# Patient Record
Sex: Female | Born: 1965 | Race: White | Hispanic: No | Marital: Married | Smoking: Never smoker
Health system: Southern US, Community
[De-identification: ages and names within clinical notes are randomized; demographics above are authoritative.]

## PROBLEM LIST (undated history)

## (undated) DIAGNOSIS — N920 Excessive and frequent menstruation with regular cycle: Secondary | ICD-10-CM

## (undated) DIAGNOSIS — R3129 Other microscopic hematuria: Secondary | ICD-10-CM

## (undated) DIAGNOSIS — F988 Other specified behavioral and emotional disorders with onset usually occurring in childhood and adolescence: Secondary | ICD-10-CM

## (undated) DIAGNOSIS — N943 Premenstrual tension syndrome: Secondary | ICD-10-CM

## (undated) HISTORY — DX: Other microscopic hematuria: R31.29

## (undated) HISTORY — DX: Other specified behavioral and emotional disorders with onset usually occurring in childhood and adolescence: F98.8

## (undated) HISTORY — DX: Excessive and frequent menstruation with regular cycle: N92.0

## (undated) HISTORY — DX: Premenstrual tension syndrome: N94.3

## (undated) HISTORY — PX: OVARIAN CYST REMOVAL: SHX89

---

## 1998-05-08 ENCOUNTER — Inpatient Hospital Stay (HOSPITAL_COMMUNITY): Admission: AD | Admit: 1998-05-08 | Discharge: 1998-05-10 | Payer: Self-pay | Admitting: Obstetrics and Gynecology

## 1998-06-18 ENCOUNTER — Other Ambulatory Visit: Admission: RE | Admit: 1998-06-18 | Discharge: 1998-06-18 | Payer: Self-pay | Admitting: Obstetrics and Gynecology

## 1999-07-01 ENCOUNTER — Other Ambulatory Visit: Admission: RE | Admit: 1999-07-01 | Discharge: 1999-07-01 | Payer: Self-pay | Admitting: Gynecology

## 1999-11-11 ENCOUNTER — Other Ambulatory Visit: Admission: RE | Admit: 1999-11-11 | Discharge: 1999-11-11 | Payer: Self-pay | Admitting: Gynecology

## 2000-09-07 ENCOUNTER — Other Ambulatory Visit: Admission: RE | Admit: 2000-09-07 | Discharge: 2000-09-07 | Payer: Self-pay | Admitting: Obstetrics and Gynecology

## 2001-07-28 ENCOUNTER — Other Ambulatory Visit: Admission: RE | Admit: 2001-07-28 | Discharge: 2001-07-28 | Payer: Self-pay | Admitting: Urology

## 2001-11-13 ENCOUNTER — Other Ambulatory Visit: Admission: RE | Admit: 2001-11-13 | Discharge: 2001-11-13 | Payer: Self-pay | Admitting: *Deleted

## 2003-02-11 ENCOUNTER — Other Ambulatory Visit: Admission: RE | Admit: 2003-02-11 | Discharge: 2003-02-11 | Payer: Self-pay | Admitting: Obstetrics and Gynecology

## 2004-02-21 ENCOUNTER — Other Ambulatory Visit: Admission: RE | Admit: 2004-02-21 | Discharge: 2004-02-21 | Payer: Self-pay | Admitting: Obstetrics and Gynecology

## 2005-06-24 ENCOUNTER — Other Ambulatory Visit: Admission: RE | Admit: 2005-06-24 | Discharge: 2005-06-24 | Payer: Self-pay | Admitting: Obstetrics and Gynecology

## 2005-09-20 ENCOUNTER — Ambulatory Visit (HOSPITAL_BASED_OUTPATIENT_CLINIC_OR_DEPARTMENT_OTHER): Admission: RE | Admit: 2005-09-20 | Discharge: 2005-09-20 | Payer: Self-pay | Admitting: Obstetrics and Gynecology

## 2005-09-20 HISTORY — PX: ENDOMETRIAL ABLATION: SHX621

## 2007-02-13 ENCOUNTER — Emergency Department (HOSPITAL_COMMUNITY): Admission: EM | Admit: 2007-02-13 | Discharge: 2007-02-13 | Payer: Self-pay | Admitting: Family Medicine

## 2008-09-06 ENCOUNTER — Other Ambulatory Visit: Admission: RE | Admit: 2008-09-06 | Discharge: 2008-09-06 | Payer: Self-pay | Admitting: Obstetrics & Gynecology

## 2008-09-11 ENCOUNTER — Encounter: Admission: RE | Admit: 2008-09-11 | Discharge: 2008-09-11 | Payer: Self-pay | Admitting: Obstetrics and Gynecology

## 2010-02-03 ENCOUNTER — Encounter: Admission: RE | Admit: 2010-02-03 | Discharge: 2010-02-03 | Payer: Self-pay | Admitting: Obstetrics and Gynecology

## 2010-09-20 ENCOUNTER — Encounter: Payer: Self-pay | Admitting: Obstetrics and Gynecology

## 2011-01-15 NOTE — Op Note (Signed)
NAMEMEMORI, SAMMON                  ACCOUNT NO.:  [id number]   MEDICAL RECORD NO.:  [id number]          PATIENT TYPE:  AMB   LOCATION:  [facility]                         FACILITY:  [facility]   PHYSICIAN:  Cynthia P. Romine, M.D.DATE OF BIRTH:  [birth year]   DATE OF PROCEDURE:  09/20/2005  DATE OF DISCHARGE:                                 OPERATIVE REPORT   PREOPERATIVE DIAGNOSIS:  Menorrhagia.   POSTOPERATIVE DIAGNOSIS:  Menorrhagia.   PROCEDURE:  Endometrial ablation using the Hydrotherm ablation technique.   SURGEON:  Cynthia P. Romine, M.D.   ANESTHESIA:  General by LMA.   ESTIMATED BLOOD LOSS:  50 mL.   COMPLICATIONS:  None. There was a small posterior cervical tear from the  tenaculum that required suturing for hemostasis, that was the genesis of the  50 mL blood loss.   DESCRIPTION OF PROCEDURE:  The patient was taken to the operating room and  after induction of adequate general anesthesia was placed in the dorsal  lithotomy position and prepped and draped in the usual fashion. She had  emptied her bladder immediately before the procedure so a catheter was not  inserted. A posterior weighted and anterior Sims retractor were placed and  the cervix was grasped off the anterior lip with a single-tooth tenaculum.  The cervix was dilated to #23 Shawnie Pons. The ablation hysteroscope was  introduced and would not pass through the canal because of anteflexion.  Therefore a second tenaculum was placed on the posterior lip of the cervix  and when pressure was applied to that it tore through. It was reapplied, the  cervix was dilated to a #25 Shawnie Pons. The scope was introduced easily.  Documentation of proper placement was taken by noting by taking a photograph  of the cavity showing the tubal ostia. The scope was withdrawn to just  inside the internal os, endometrial ablation was carried out in the usual  fashion. After the 10 minute ablation period, photographic documentation was  again taken of  the cavity. The scope was withdrawn, the instruments removed  from the cervix. Two sutures of #0 chromic were placed on the posterior lip  of the cervix for hemostasis which was achieved easily. The instruments  removed from the vagina and the procedure was terminated. The patient  tolerated it well and went to post anesthesia recovery in satisfactory  condition.           ______________________________  Edwena Felty. Romine, M.D.     CPR/MEDQ  D:  09/20/2005  T:  09/21/2005  Job:  [id number]

## 2011-09-20 ENCOUNTER — Other Ambulatory Visit: Payer: Self-pay | Admitting: Obstetrics and Gynecology

## 2011-09-20 DIAGNOSIS — Z1231 Encounter for screening mammogram for malignant neoplasm of breast: Secondary | ICD-10-CM

## 2011-10-07 ENCOUNTER — Ambulatory Visit: Payer: Self-pay

## 2011-10-07 ENCOUNTER — Ambulatory Visit
Admission: RE | Admit: 2011-10-07 | Discharge: 2011-10-07 | Disposition: A | Discharge status: Home / Self Care | Payer: BC Managed Care – PPO | Source: Ambulatory Visit | Attending: Obstetrics and Gynecology | Admitting: Obstetrics and Gynecology

## 2011-10-07 DIAGNOSIS — Z1231 Encounter for screening mammogram for malignant neoplasm of breast: Secondary | ICD-10-CM

## 2012-05-22 LAB — HM PAP SMEAR: HM Pap smear: NEGATIVE

## 2012-10-05 ENCOUNTER — Other Ambulatory Visit: Payer: Self-pay | Admitting: Obstetrics and Gynecology

## 2012-10-05 DIAGNOSIS — Z1231 Encounter for screening mammogram for malignant neoplasm of breast: Secondary | ICD-10-CM

## 2012-11-09 ENCOUNTER — Ambulatory Visit: Payer: BC Managed Care – PPO

## 2012-12-04 ENCOUNTER — Ambulatory Visit
Admission: RE | Admit: 2012-12-04 | Discharge: 2012-12-04 | Disposition: A | Discharge status: Home / Self Care | Payer: 59 | Source: Ambulatory Visit | Attending: Obstetrics and Gynecology | Admitting: Obstetrics and Gynecology

## 2012-12-04 DIAGNOSIS — Z1231 Encounter for screening mammogram for malignant neoplasm of breast: Secondary | ICD-10-CM

## 2013-05-24 ENCOUNTER — Ambulatory Visit: Payer: Self-pay | Admitting: Nurse Practitioner

## 2013-06-04 ENCOUNTER — Encounter: Payer: Self-pay | Admitting: Nurse Practitioner

## 2013-06-05 ENCOUNTER — Ambulatory Visit (INDEPENDENT_AMBULATORY_CARE_PROVIDER_SITE_OTHER): Payer: 59 | Admitting: Nurse Practitioner

## 2013-06-05 ENCOUNTER — Encounter: Payer: Self-pay | Admitting: Nurse Practitioner

## 2013-06-05 VITALS — BP 130/76 | HR 76 | Resp 16 | Ht 64.5 in | Wt 132.0 lb

## 2013-06-05 DIAGNOSIS — Z23 Encounter for immunization: Secondary | ICD-10-CM

## 2013-06-05 DIAGNOSIS — Z0000 Encounter for general adult medical examination without abnormal findings: Secondary | ICD-10-CM

## 2013-06-05 DIAGNOSIS — Z01419 Encounter for gynecological examination (general) (routine) without abnormal findings: Secondary | ICD-10-CM

## 2013-06-05 MED ORDER — FLUOXETINE HCL 20 MG PO CAPS: 20.0000 mg | ORAL_CAPSULE | Freq: Every day | ORAL | Status: DC

## 2013-06-05 NOTE — Patient Instructions (Signed)

## 2013-06-05 NOTE — Progress Notes (Signed)
Patient ID: Sabrina Blair, female   DOB: 1965/10/18, 47 y.o.   MRN: [id number] 47 y.o. [id number] Married Caucasian Fe here for annual exam. Doing well.  Menses in regular at 26 - 28 days, lasting 2-3 days. Some headaches, oc migraine.  PMS is better with Prozac med's.   Patient's last menstrual period was 05/30/2013.          Sexually active: yes  The current method of family planning is vasectomy.    Exercising: yes  tennis 3-4 times per week Smoker:  no  Health Maintenance: Pap: 923/13, WNL, neg HR HPV MMG: 12/04/12, BI-RADS 1: negative TDaP: 09/2002 Labs: HB: 12.0 Urine:  Trace blood, pH 5.0   reports that she has never smoked. She has never used smokeless tobacco. She reports that she does not drink alcohol or use illicit drugs.  Past Medical History  Diagnosis Date  . Family history of PMS (premenstrual syndrome)     better on prozac  . Microscopic hematuria     neg uro workup, Dr. Aldean Ast  . Attention deficit disorder of adult     Past Surgical History  Procedure Laterality Date  . Endometrial ablation  09/20/05    Current Outpatient Prescriptions  Medication Sig Dispense Refill  . FLUoxetine (PROZAC) 20 MG capsule Take 20 mg by mouth daily.      . Multiple Vitamin (MULTIVITAMIN) tablet Take 1 tablet by mouth daily.      . Naproxen Sodium (ALEVE) 220 MG CAPS Take by mouth as needed.       No current facility-administered medications for this visit.    Family History  Problem Relation Age of Onset  . Breast cancer Mother 17  . Breast cancer Maternal Aunt   . Breast cancer Maternal Aunt     ROS:  Pertinent items are noted in HPI.  Otherwise, a comprehensive ROS was negative.  Exam:   BP 130/76  Pulse 76  Resp 16  Ht 5' 4.5" (1.638 m)  Wt 132 lb (59.875 kg)  BMI 22.32 kg/m2  LMP 05/30/2013 Height: 5' 4.5" (163.8 cm)  Ht Readings from Last 3 Encounters:  06/05/13 5' 4.5" (1.638 m)    General appearance: alert, cooperative and appears stated age Head:  Normocephalic, without obvious abnormality, atraumatic Neck: no adenopathy, supple, symmetrical, trachea midline and thyroid normal to inspection and palpation Lungs: clear to auscultation bilaterally Breasts: normal appearance, no masses or tenderness Heart: regular rate and rhythm Abdomen: soft, non-tender; no masses,  no organomegaly Extremities: extremities normal, atraumatic, no cyanosis or edema Skin: Skin color, texture, turgor normal. No rashes or lesions Lymph nodes: Cervical, supraclavicular, and axillary nodes normal. No abnormal inguinal nodes palpated Neurologic: Grossly normal   Pelvic: External genitalia:  no lesions              Urethra:  normal appearing urethra with no masses, tenderness or lesions              Bartholin's and Skene's: normal                 Vagina: normal appearing vagina with normal color and discharge, no lesions              Cervix: anteverted              Pap taken: no Bimanual Exam:  Uterus:  normal size, contour, position, consistency, mobility, non-tender              Adnexa: no mass, fullness, tenderness  Rectovaginal: Confirms               Anus:  normal sphincter tone, no lesions  A:  Well Woman with normal exam  Normal menses  History of PMS  Update immunization  P:   Pap smear as per guidelines   Mammogram is due now and is scheduled  Return for fasting labs  Colonoscopy to be scheduled  TDaP given today  Counseled on breast self exam, adequate intake of calcium and vitamin D, diet and exercise return annually or prn  An After Visit Summary was printed and given to the patient.

## 2013-06-08 ENCOUNTER — Other Ambulatory Visit (INDEPENDENT_AMBULATORY_CARE_PROVIDER_SITE_OTHER): Payer: 59

## 2013-06-08 DIAGNOSIS — Z0000 Encounter for general adult medical examination without abnormal findings: Secondary | ICD-10-CM

## 2013-06-08 LAB — LIPID PANEL
Cholesterol: 159 mg/dL (ref 0–200)
HDL: 74 mg/dL (ref >39)
LDL Cholesterol: 75 mg/dL (ref 0–99)
Triglycerides: 48 mg/dL (ref <150)
VLDL: 10 mg/dL (ref 0–40)

## 2013-06-08 LAB — COMPREHENSIVE METABOLIC PANEL
Albumin: 4 g/dL (ref 3.5–5.2)
Alkaline Phosphatase: 43 U/L (ref 39–117)
BUN: 10 mg/dL (ref 6–23)
CO2: 26 mEq/L (ref 19–32)
Calcium: 9.3 mg/dL (ref 8.4–10.5)
Chloride: 104 mEq/L (ref 96–112)
Creat: 0.67 mg/dL (ref 0.50–1.10)
Glucose, Bld: 81 mg/dL (ref 70–99)
Potassium: 5.1 mEq/L (ref 3.5–5.3)

## 2013-06-10 NOTE — Progress Notes (Signed)
Encounter reviewed by Dr. Conley Simmonds.  Mammogram 12/04/12 - normal.

## 2013-07-05 ENCOUNTER — Other Ambulatory Visit: Payer: Self-pay

## 2014-05-07 ENCOUNTER — Other Ambulatory Visit: Payer: Self-pay

## 2014-05-07 DIAGNOSIS — Z1231 Encounter for screening mammogram for malignant neoplasm of breast: Secondary | ICD-10-CM

## 2014-05-16 ENCOUNTER — Ambulatory Visit: Payer: 59

## 2014-05-23 ENCOUNTER — Other Ambulatory Visit: Payer: Self-pay

## 2014-05-23 ENCOUNTER — Ambulatory Visit
Admission: RE | Admit: 2014-05-23 | Discharge: 2014-05-23 | Disposition: A | Discharge status: Home / Self Care | Payer: 59 | Source: Ambulatory Visit

## 2014-05-23 DIAGNOSIS — Z1231 Encounter for screening mammogram for malignant neoplasm of breast: Secondary | ICD-10-CM

## 2014-05-27 ENCOUNTER — Other Ambulatory Visit: Payer: Self-pay | Admitting: Nurse Practitioner

## 2014-05-27 DIAGNOSIS — R928 Other abnormal and inconclusive findings on diagnostic imaging of breast: Secondary | ICD-10-CM

## 2014-06-03 ENCOUNTER — Ambulatory Visit
Admission: RE | Admit: 2014-06-03 | Discharge: 2014-06-03 | Disposition: A | Discharge status: Home / Self Care | Payer: 59 | Source: Ambulatory Visit | Attending: Nurse Practitioner | Admitting: Nurse Practitioner

## 2014-06-03 DIAGNOSIS — R928 Other abnormal and inconclusive findings on diagnostic imaging of breast: Secondary | ICD-10-CM

## 2014-06-24 ENCOUNTER — Encounter: Payer: Self-pay | Admitting: Nurse Practitioner

## 2014-06-24 ENCOUNTER — Ambulatory Visit (INDEPENDENT_AMBULATORY_CARE_PROVIDER_SITE_OTHER): Payer: 59 | Admitting: Nurse Practitioner

## 2014-06-24 VITALS — BP 120/76 | HR 72 | Ht 65.0 in | Wt 136.0 lb

## 2014-06-24 DIAGNOSIS — Z1211 Encounter for screening for malignant neoplasm of colon: Secondary | ICD-10-CM

## 2014-06-24 DIAGNOSIS — Z0000 Encounter for general adult medical examination without abnormal findings: Secondary | ICD-10-CM

## 2014-06-24 DIAGNOSIS — Z01419 Encounter for gynecological examination (general) (routine) without abnormal findings: Secondary | ICD-10-CM

## 2014-06-24 DIAGNOSIS — R319 Hematuria, unspecified: Secondary | ICD-10-CM

## 2014-06-24 LAB — POCT URINALYSIS DIPSTICK
Bilirubin, UA: NEGATIVE
GLUCOSE UA: NEGATIVE
Ketones, UA: NEGATIVE
Leukocytes, UA: NEGATIVE
Nitrite, UA: NEGATIVE
Protein, UA: NEGATIVE
UROBILINOGEN UA: NEGATIVE
pH, UA: 7

## 2014-06-24 MED ORDER — FLUOXETINE HCL 20 MG PO CAPS: 20.0000 mg | ORAL_CAPSULE | Freq: Every day | ORAL | Status: DC

## 2014-06-24 NOTE — Patient Instructions (Signed)

## 2014-06-24 NOTE — Progress Notes (Signed)
Patient ID: Sabrina LouisLisa D Golightly, female   DOB: 1965-10-04, 48 y.o.   MRN: [id number] 48 y.o. [id number] Married Caucasian Fe here for annual exam.  Menses is 26- 28 lasting 3 days.  Usually light since the HTA ablation 08/2005.   Patient's last menstrual period was 06/13/2014.          Sexually active: yes   The current method of family planning is vasectomy.     Exercising: yes  tennis 3-4 times per week Smoker:  no  Health Maintenance: Pap: 923/13, WNL, neg HR HPV MMG: 05/24/14, diagnostic and ultrasound 06/03/14, BI-RADS 2:  Benign findings, repeat in 1 year Colonoscopy:  never TDaP: 06/05/13 Labs:  HB:  12.1     Urine:  Trace RBC   reports that she has never smoked. She has never used smokeless tobacco. She reports that she does not drink alcohol or use illicit drugs.  Past Medical History  Diagnosis Date  . Microscopic hematuria     neg uro workup, Dr. Aldean AstKimbrough  . Attention deficit disorder of adult   . PMS (premenstrual syndrome)     symptoms better on Prozac  . Menorrhagia     Past Surgical History  Procedure Laterality Date  . Ovarian cyst removal  age 48  . Endometrial ablation  09/20/05    HTA    Current Outpatient Prescriptions  Medication Sig Dispense Refill  . FLUoxetine (PROZAC) 20 MG capsule Take 1 capsule (20 mg total) by mouth daily.  90 capsule  3  . methylphenidate (CONCERTA) 36 MG CR tablet Take 36 mg by mouth every morning.      . Multiple Vitamin (MULTIVITAMIN) tablet Take 1 tablet by mouth daily.      . Naproxen Sodium (ALEVE) 220 MG CAPS Take by mouth as needed.       No current facility-administered medications for this visit.    Family History  Problem Relation Age of Onset  . Breast cancer Mother 958  . Hypertension Mother   . Hyperlipidemia Mother   . Osteoarthritis Mother   . Breast cancer Maternal Aunt     postmeno  . Breast cancer Maternal Aunt     postmeno  . Diabetes Maternal Aunt   . Diabetes Maternal Grandfather     ROS:  Pertinent items  are noted in HPI.  Otherwise, a comprehensive ROS was negative.  Exam:   BP 120/76  Pulse 72  Ht 5\' 5"  (1.651 m)  Wt 136 lb (61.689 kg)  BMI 22.63 kg/m2  LMP 06/13/2014 Height: 5\' 5"  (165.1 cm)  Ht Readings from Last 3 Encounters:  06/24/14 5\' 5"  (1.651 m)  06/05/13 5' 4.5" (1.638 m)    General appearance: alert, cooperative and appears stated age Head: Normocephalic, without obvious abnormality, atraumatic Neck: no adenopathy, supple, symmetrical, trachea midline and thyroid normal to inspection and palpation Lungs: clear to auscultation bilaterally Breasts: normal appearance, no masses or tenderness Heart: regular rate and rhythm Abdomen: soft, non-tender; no masses,  no organomegaly Extremities: extremities normal, atraumatic, no cyanosis or edema Skin: Skin color, texture, turgor normal. No rashes or lesions Lymph nodes: Cervical, supraclavicular, and axillary nodes normal. No abnormal inguinal nodes palpated Neurologic: Grossly normal   Pelvic: External genitalia:  no lesions              Urethra:  normal appearing urethra with no masses, tenderness or lesions              Bartholin's and Skene's: normal  Vagina: normal appearing vagina with normal color and discharge, no lesions              Cervix: anteverted              Pap taken: No. Bimanual Exam:  Uterus:  normal size, contour, position, consistency, mobility, non-tender              Adnexa: no mass, fullness, tenderness               Rectovaginal: Confirms               Anus:  normal sphincter tone, no lesions  A:  Well Woman with normal exam  Normal menses   S/P HTA ablation 08/2005, husband vasectomy  History of PMS does well on SSRI  History of microalbinuria  History of ADD  FMH: breast cancer - mother and 2 MA    P:   Reviewed health and wellness pertinent to exam  Pap smear not taken today  Mammogram is due 9/16  Will follow with urine C&S and micro  Refill Prozac 20 mg daily for a  year  Will get GI referral for screening colonoscopy  Counseled on breast self exam, mammography screening, adequate intake of calcium and vitamin D, diet and exercise, Kegel's exercises return annually or prn  An After Visit Summary was printed and given to the patient.

## 2014-06-25 ENCOUNTER — Encounter: Payer: Self-pay | Admitting: Nurse Practitioner

## 2014-06-25 LAB — URINALYSIS, MICROSCOPIC ONLY
Bacteria, UA: NONE SEEN
CRYSTALS: NONE SEEN
Casts: NONE SEEN
Squamous Epithelial / LPF: NONE SEEN

## 2014-06-25 LAB — URINE CULTURE
Colony Count: NO GROWTH
Organism ID, Bacteria: NO GROWTH

## 2014-06-25 LAB — HEMOGLOBIN, FINGERSTICK: Hemoglobin, fingerstick: 12.1 g/dL (ref 12.0–16.0)

## 2014-06-26 NOTE — Progress Notes (Signed)
Encounter reviewed by Dr. Brook Silva.  

## 2014-07-01 ENCOUNTER — Encounter: Payer: Self-pay | Admitting: Nurse Practitioner

## 2014-12-30 ENCOUNTER — Other Ambulatory Visit: Payer: Self-pay | Admitting: Nurse Practitioner

## 2014-12-30 NOTE — Telephone Encounter (Signed)
Medication refill request: Prozac  Last AEX:  06/24/14 PG Next AEX:  Last MMG (if hormonal medication request): 06/03/14 BIRADS2:Benign  Refill authorized: 06/24/14 #90caps/ 3 R to [facility].

## 2015-07-04 ENCOUNTER — Other Ambulatory Visit: Payer: Self-pay

## 2015-07-04 ENCOUNTER — Other Ambulatory Visit: Payer: Self-pay | Admitting: Nurse Practitioner

## 2015-07-04 DIAGNOSIS — Z1231 Encounter for screening mammogram for malignant neoplasm of breast: Secondary | ICD-10-CM

## 2015-07-04 NOTE — Telephone Encounter (Signed)
Medication refill request: Prozac  Last AEX:  06/24/14 PG Next AEX: ? Last MMG (if hormonal medication request): 06/03/14 BIRADS2:benign  Refill authorized: 06/24/14 #90/3R.   Left voicemail to call back to schedule AEX and MMG.  Please advise.

## 2015-07-07 ENCOUNTER — Encounter: Payer: Self-pay | Admitting: Nurse Practitioner

## 2015-07-07 ENCOUNTER — Ambulatory Visit (INDEPENDENT_AMBULATORY_CARE_PROVIDER_SITE_OTHER): Payer: 59 | Admitting: Nurse Practitioner

## 2015-07-07 VITALS — BP 118/80 | Ht 64.75 in | Wt 140.0 lb

## 2015-07-07 DIAGNOSIS — Z0000 Encounter for general adult medical examination without abnormal findings: Secondary | ICD-10-CM | POA: Diagnosis not present

## 2015-07-07 DIAGNOSIS — Z01419 Encounter for gynecological examination (general) (routine) without abnormal findings: Secondary | ICD-10-CM | POA: Diagnosis not present

## 2015-07-07 LAB — POCT URINALYSIS DIPSTICK
BILIRUBIN UA: NEGATIVE
Glucose, UA: NEGATIVE
Ketones, UA: NEGATIVE
Leukocytes, UA: NEGATIVE
NITRITE UA: NEGATIVE
Protein, UA: NEGATIVE
RBC UA: NEGATIVE
UROBILINOGEN UA: NEGATIVE
pH, UA: 8

## 2015-07-07 MED ORDER — FLUOXETINE HCL 20 MG PO CAPS: ORAL_CAPSULE | ORAL | Status: DC

## 2015-07-07 NOTE — Progress Notes (Signed)
Patient ID: Sabrina Blair, female   DOB: 1966/05/25, 49 y.o.   MRN: [id number] 49 y.o. G36P3003 Married  Caucasian Fe here for annual exam.  Last month had 8 lbs weight gain - not better after menses. No change in diet or exercise. This past period was 12 days late and flow was light but normal.  Pain right lower quadrant that cleared with menses.  She has been taking a very close girlfriend to [place] every 2 weeks this past many months for cancer treatments.  They leave on Monday and return on Thursday.  Patient's last menstrual period was 06/12/2015.          Sexually active: Yes.    The current method of family planning is vasectomy.    Exercising: Yes.    cross training and plays tennis Smoker:  no  Health Maintenance: Pap: 05/22/12, WNL, neg HR HPV MMG: 05/24/14, diagnostic and ultrasound 06/03/14, Bi-Rads 2:  Benign findings, repeat in 1 year; apt 08/12/15 Colonoscopy:  09/09/14, normal, repeat in 10 years TDaP: 06/05/13 Labs:  HB: declined   Urine:  Negative    reports that she has never smoked. She has never used smokeless tobacco. She reports that she does not drink alcohol or use illicit drugs.  Past Medical History  Diagnosis Date  . Microscopic hematuria     neg uro workup, Dr. Serita Butcher  . Attention deficit disorder of adult   . PMS (premenstrual syndrome)     symptoms better on Prozac  . Menorrhagia     Past Surgical History  Procedure Laterality Date  . Ovarian cyst removal  age 44  . Endometrial ablation  09/20/05    HTA    Current Outpatient Prescriptions  Medication Sig Dispense Refill  . cetirizine (ZYRTEC) 10 MG tablet Take 10 mg by mouth daily.    Marland Kitchen FLUoxetine (PROZAC) 20 MG capsule TAKE 1 CAPSULE (20 MG TOTAL) BY MOUTH DAILY. 30 capsule 0  . methylphenidate (CONCERTA) 36 MG CR tablet Take 36 mg by mouth every morning.    . Multiple Vitamin (MULTIVITAMIN) tablet Take 1 tablet by mouth daily.    . Naproxen Sodium (ALEVE) 220 MG CAPS Take by mouth as needed.      No current facility-administered medications for this visit.    Family History  Problem Relation Age of Onset  . Breast cancer Mother 29  . Hypertension Mother   . Hyperlipidemia Mother   . Osteoarthritis Mother   . Breast cancer Maternal Aunt     postmeno  . Breast cancer Maternal Aunt     postmeno  . Diabetes Maternal Aunt   . Diabetes Maternal Grandfather     ROS:  Pertinent items are noted in HPI.  Otherwise, a comprehensive ROS was negative.  Exam:   BP 118/80 mmHg  Ht 5' 4.75" (1.645 m)  Wt 140 lb (63.504 kg)  BMI 23.47 kg/m2  LMP 06/12/2015 Height: 5' 4.75" (164.5 cm) Ht Readings from Last 3 Encounters:  07/07/15 5' 4.75" (1.645 m)  06/24/14 '5\' 5"'  (1.651 m)  06/05/13 5' 4.5" (1.638 m)    General appearance: alert, cooperative and appears stated age Head: Normocephalic, without obvious abnormality, atraumatic Neck: no adenopathy, supple, symmetrical, trachea midline and thyroid normal to inspection and palpation Lungs: clear to auscultation bilaterally Breasts: normal appearance, no masses or tenderness Heart: regular rate and rhythm Abdomen: soft, non-tender; no masses,  no organomegaly Extremities: extremities normal, atraumatic, no cyanosis or edema Skin: Skin color, texture, turgor normal. No  rashes or lesions Lymph nodes: Cervical, supraclavicular, and axillary nodes normal. No abnormal inguinal nodes palpated Neurologic: Grossly normal   Pelvic: External genitalia:  no lesions              Urethra:  normal appearing urethra with no masses, tenderness or lesions              Bartholin's and Skene's: normal                 Vagina: normal appearing vagina with normal color and discharge, no lesions              Cervix: anteverted              Pap taken: Yes.   Bimanual Exam:  Uterus:  normal size, contour, position, consistency, mobility, non-tender              Adnexa: no mass, fullness, tenderness               Rectovaginal: Confirms                Anus:  normal sphincter tone, no lesions  Chaperone present: yes  A:  Well Woman with normal exam  Normal menses  S/P HTA ablation 08/2005, husband vasectomy History of PMS does well on SSRI History of microalbinuria History of ADD [place]: breast cancer - mother and 2 MA, 1 maternal first cousin   P:   Reviewed health and wellness pertinent to exam  Pap smear as above  Information about genetic testing - BRCA Info and encouraged again to get tested  Mammogram is due and scheduled for 08/12/15  Refill on Prozac 20 mg daily for a year  Counseled on breast self exam, mammography screening, adequate intake of calcium and vitamin D, diet and exercise return annually or prn  An After Visit Summary was printed and given to the patient.

## 2015-07-07 NOTE — Patient Instructions (Signed)

## 2015-07-09 LAB — IPS PAP TEST WITH HPV

## 2015-07-13 NOTE — Progress Notes (Signed)
Encounter reviewed by Dr. Brook Amundson C. Silva.  

## 2015-08-12 ENCOUNTER — Ambulatory Visit: Payer: Self-pay

## 2015-08-19 ENCOUNTER — Ambulatory Visit
Admission: RE | Admit: 2015-08-19 | Discharge: 2015-08-19 | Disposition: A | Discharge status: Home / Self Care | Payer: 59 | Source: Ambulatory Visit

## 2015-08-19 DIAGNOSIS — Z1231 Encounter for screening mammogram for malignant neoplasm of breast: Secondary | ICD-10-CM

## 2015-08-21 ENCOUNTER — Other Ambulatory Visit: Payer: Self-pay | Admitting: Nurse Practitioner

## 2015-08-21 DIAGNOSIS — R928 Other abnormal and inconclusive findings on diagnostic imaging of breast: Secondary | ICD-10-CM

## 2015-08-27 ENCOUNTER — Ambulatory Visit
Admission: RE | Admit: 2015-08-27 | Discharge: 2015-08-27 | Disposition: A | Discharge status: Home / Self Care | Payer: 59 | Source: Ambulatory Visit | Attending: Nurse Practitioner | Admitting: Nurse Practitioner

## 2015-08-27 ENCOUNTER — Other Ambulatory Visit: Payer: Self-pay | Admitting: Nurse Practitioner

## 2015-08-27 DIAGNOSIS — R928 Other abnormal and inconclusive findings on diagnostic imaging of breast: Secondary | ICD-10-CM

## 2015-08-28 ENCOUNTER — Encounter: Payer: Self-pay | Admitting: Obstetrics & Gynecology

## 2015-11-05 ENCOUNTER — Other Ambulatory Visit: Payer: Self-pay | Admitting: Nurse Practitioner

## 2015-11-24 IMAGING — MG MM SCREENING BREAST TOMO BILATERAL
8 series · 9 of 24 positions shown · non-contrast
Comparison: Previous exam(s)

CLINICAL DATA: Screening.

EXAM:
DIGITAL SCREENING BILATERAL MAMMOGRAM WITH 3D TOMO WITH CAD

[L CC]
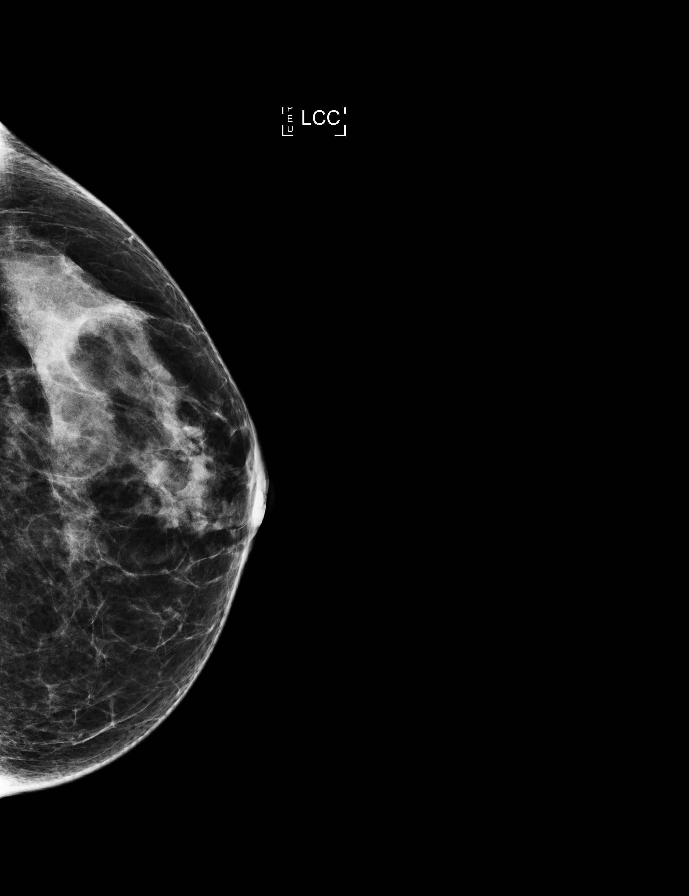

[L MLO]
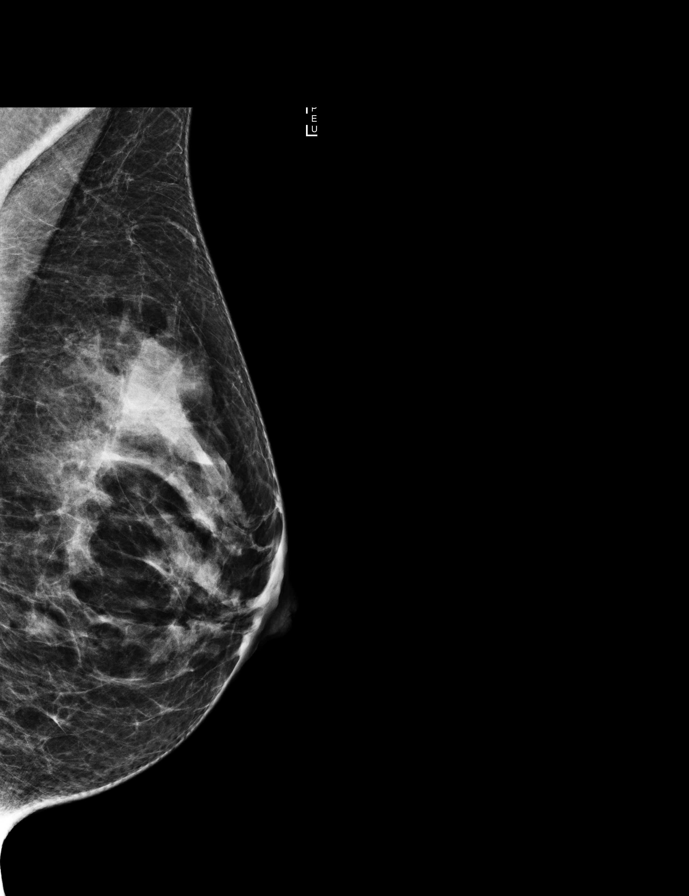

[R CC]
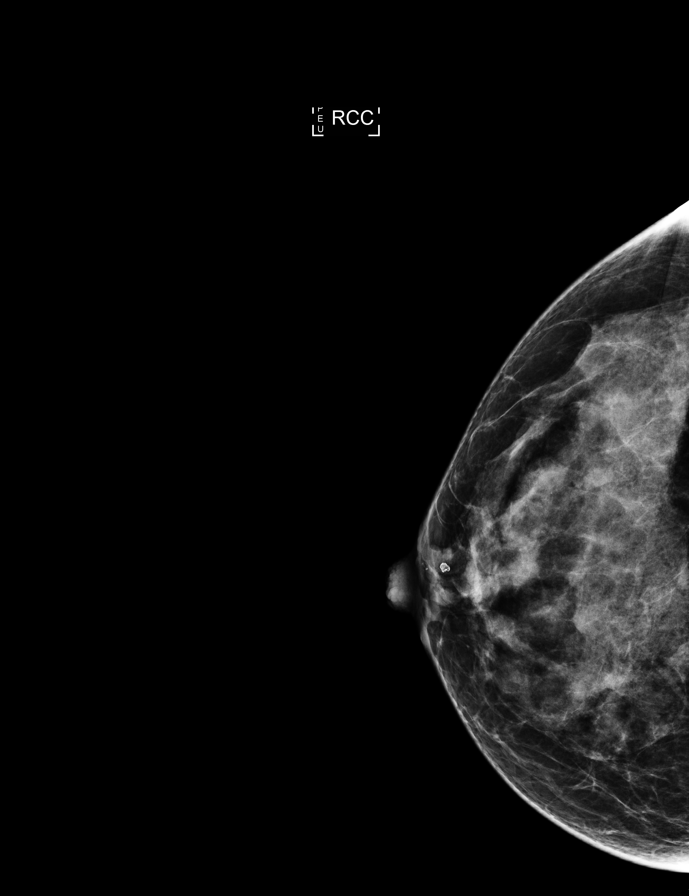

[R MLO]
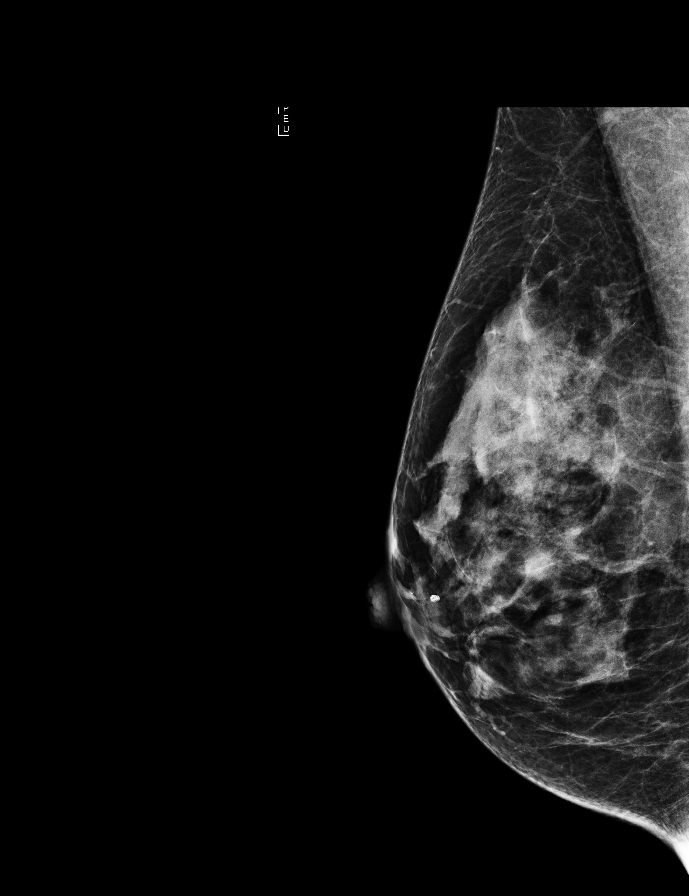

[R CC tomo · 2 of 54 frames shown]
[frame 18/54]
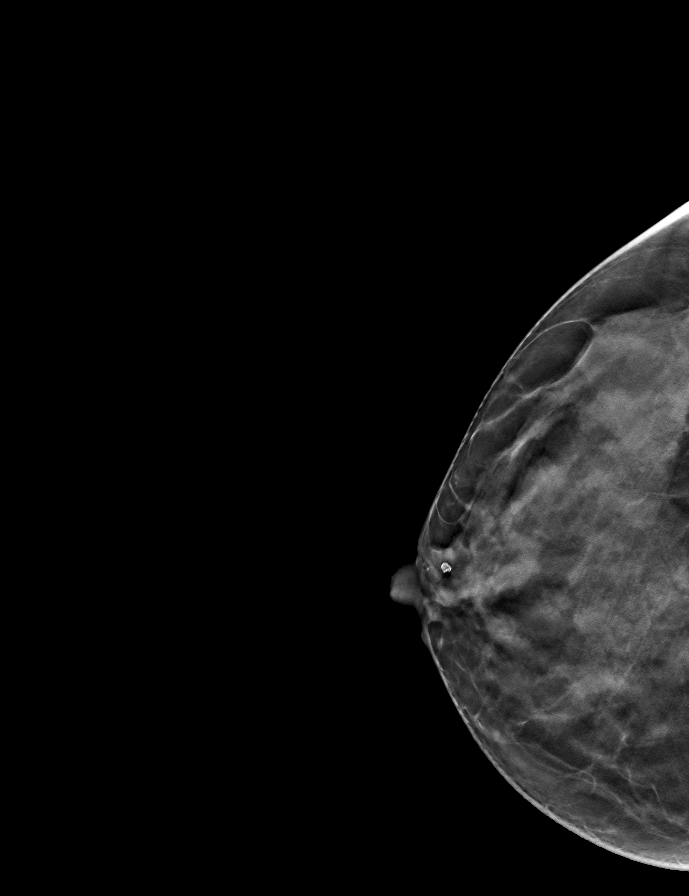
[frame 27/54]
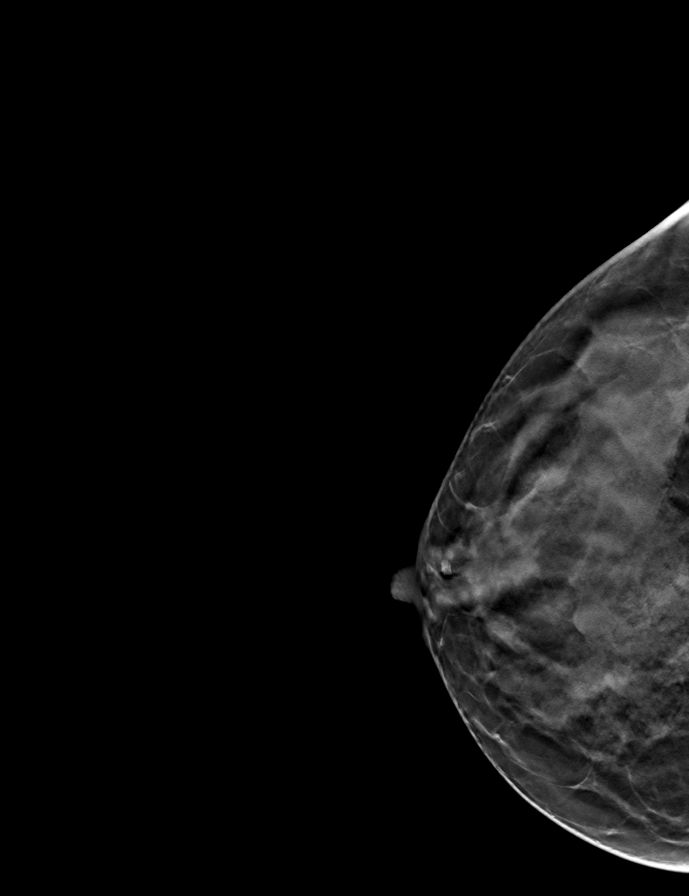

[R MLO tomo · tomo slice 27/53.0]
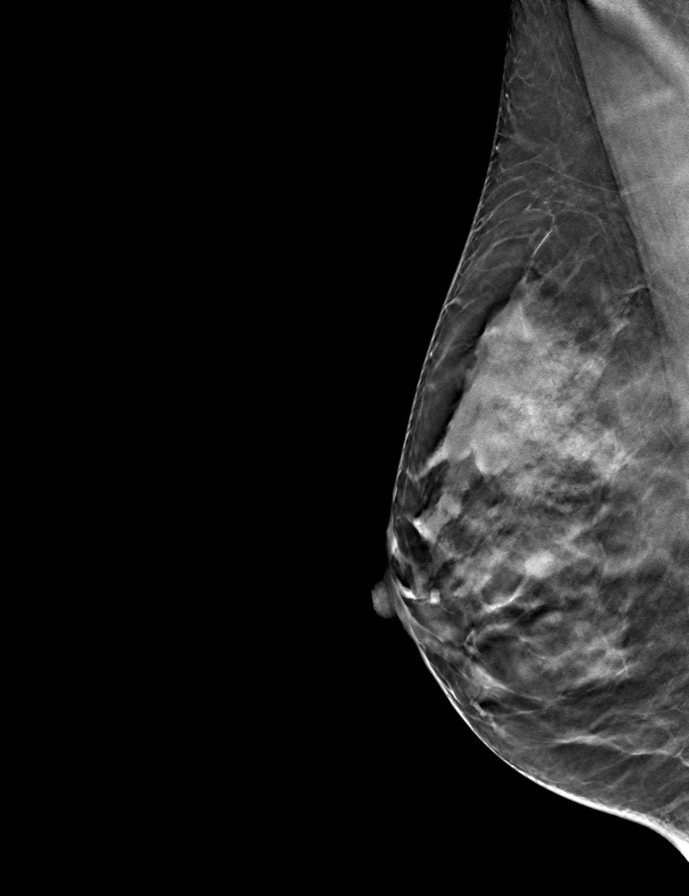

[L CC tomo · tomo slice 27/54.0]
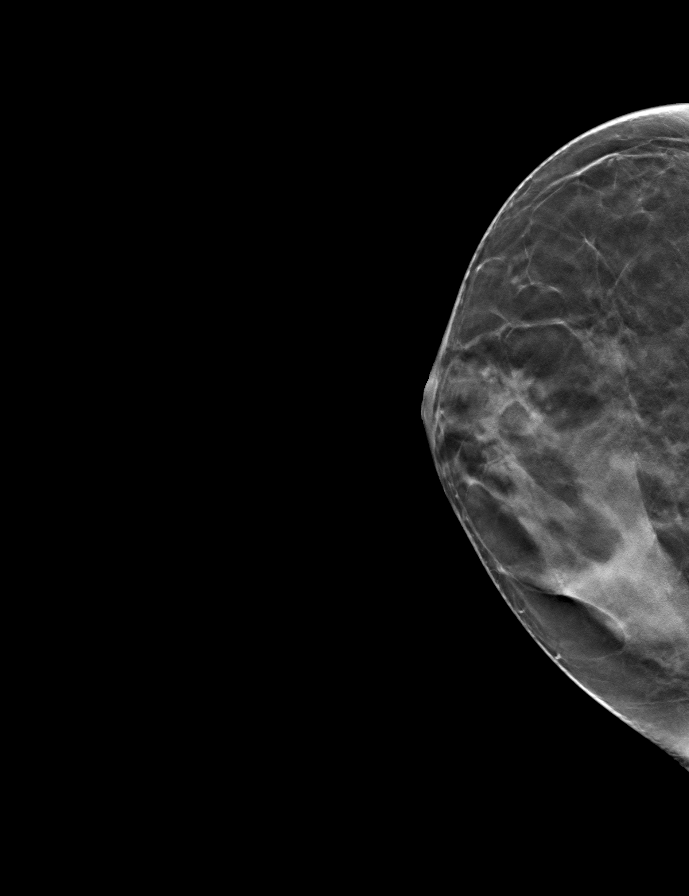

[L MLO tomo · tomo slice 28/55.0]
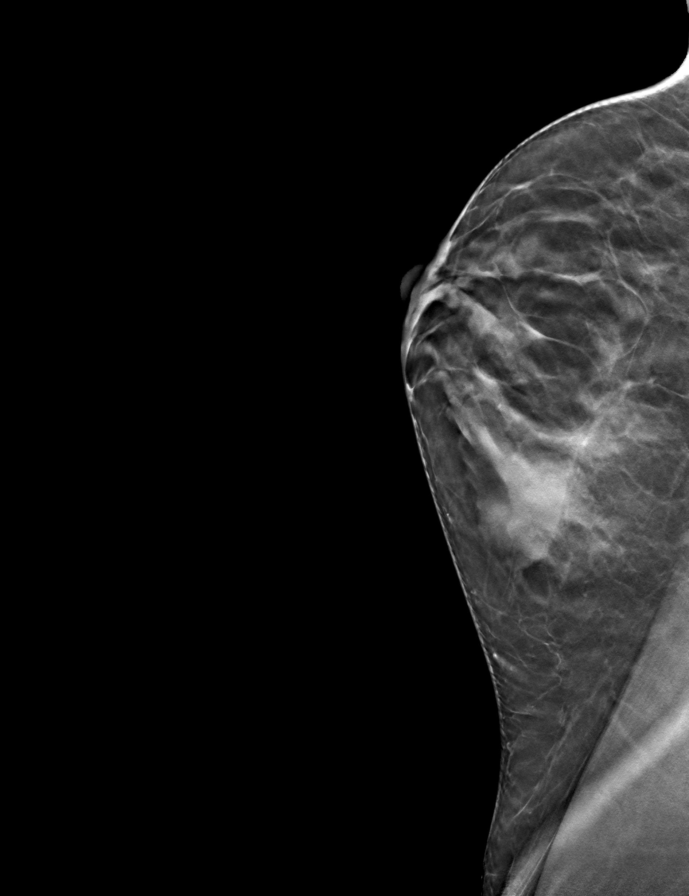

[9 of 24 positions shown; findings below may reference images not displayed]

ACR Breast Density Category c: The breast tissue is heterogeneously
dense, which may obscure small masses.
FINDINGS: In the right breast, a possible mass warrants further evaluation
with spot compression views and possibly ultrasound. In the left
breast, no findings suspicious for malignancy. Images were processed
with CAD.
IMPRESSION: Further evaluation is suggested for possible mass in the right
breast.

RECOMMENDATION:
Diagnostic mammogram and possibly ultrasound of the right breast.
(Code:[id number]-[id number])

The patient will be contacted regarding the findings, and additional
imaging will be scheduled.

BI-RADS CATEGORY  0: Incomplete. Need additional imaging evaluation
and/or prior mammograms for comparison.

## 2016-07-12 ENCOUNTER — Ambulatory Visit: Payer: 59 | Admitting: Nurse Practitioner

## 2016-08-02 ENCOUNTER — Ambulatory Visit (INDEPENDENT_AMBULATORY_CARE_PROVIDER_SITE_OTHER): Payer: 59 | Admitting: Nurse Practitioner

## 2016-08-02 ENCOUNTER — Encounter: Payer: Self-pay | Admitting: Nurse Practitioner

## 2016-08-02 VITALS — BP 110/72 | HR 72 | Ht 62.75 in | Wt 143.0 lb

## 2016-08-02 DIAGNOSIS — Z803 Family history of malignant neoplasm of breast: Secondary | ICD-10-CM | POA: Diagnosis not present

## 2016-08-02 DIAGNOSIS — Z01419 Encounter for gynecological examination (general) (routine) without abnormal findings: Secondary | ICD-10-CM

## 2016-08-02 DIAGNOSIS — R8290 Unspecified abnormal findings in urine: Secondary | ICD-10-CM

## 2016-08-02 DIAGNOSIS — Z0000 Encounter for general adult medical examination without abnormal findings: Secondary | ICD-10-CM | POA: Diagnosis not present

## 2016-08-02 LAB — POCT URINALYSIS DIPSTICK
Bilirubin, UA: NEGATIVE
Glucose, UA: NEGATIVE
Ketones, UA: NEGATIVE
LEUKOCYTES UA: NEGATIVE
Nitrite, UA: NEGATIVE
PH UA: 7
PROTEIN UA: NEGATIVE
UROBILINOGEN UA: NEGATIVE

## 2016-08-02 LAB — CBC WITH DIFFERENTIAL/PLATELET
BASOS ABS: 63 {cells}/uL (ref 0–200)
Basophils Relative: 1 %
EOS ABS: 189 {cells}/uL (ref 15–500)
EOS PCT: 3 %
HCT: 37.9 % (ref 35.0–45.0)
Hemoglobin: 12.4 g/dL (ref 11.7–15.5)
LYMPHS PCT: 32 %
Lymphs Abs: 2016 cells/uL (ref 850–3900)
MCH: 29.8 pg (ref 27.0–33.0)
MCHC: 32.7 g/dL (ref 32.0–36.0)
MCV: 91.1 fL (ref 80.0–100.0)
MONOS PCT: 9 %
MPV: 9.6 fL (ref 7.5–12.5)
Monocytes Absolute: 567 cells/uL (ref 200–950)
NEUTROS ABS: 3465 {cells}/uL (ref 1500–7800)
Neutrophils Relative %: 55 %
PLATELETS: 321 10*3/uL (ref 140–400)
RBC: 4.16 MIL/uL (ref 3.80–5.10)
RDW: 13.4 % (ref 11.0–15.0)
WBC: 6.3 10*3/uL (ref 3.8–10.8)

## 2016-08-02 LAB — POCT URINE PREGNANCY: Preg Test, Ur: NEGATIVE

## 2016-08-02 MED ORDER — MEDROXYPROGESTERONE ACETATE 5 MG PO TABS: 5.0000 mg | ORAL_TABLET | Freq: Every day | ORAL | 0 refills | Status: DC

## 2016-08-02 NOTE — Patient Instructions (Signed)

## 2016-08-02 NOTE — Progress Notes (Signed)
Patient ID: Sabrina Blair, female   DOB: Aug 02, 1966, 50 y.o.   MRN: [id number]  50 y.o. [id number] Married  Caucasian Fe here for annual exam.  Menses is fairly regular.  Currently at 10 days late. Some vaso symptoms that gave her extreme heat in the summer while playing tennis.  No real hot flashes.  Some increase in vaginal dryness.  Currently since cycle is late she is feeling more "on edge".  Her friend that she was taking back and forth to [place] passes this summer from her colon cancer - age 50.  She is now willing to see genetic counselor for her [facility].  Patient's last menstrual period was 07/02/2016 (exact date).          Sexually active: Yes.    The current method of family planning is vasectomy.    Exercising: Yes.    plays tennis Smoker:  no  Health Maintenance: Pap: 07/07/15, Negative with neg HR HPV MMG: 08/19/15 3D with left Diagnostic and Ultrasound on 08/27/15, Bi-Rads 2: Benign Findings Colonoscopy:  09/09/14, normal, repeat in 10 years TDaP: 06/05/13 Hep C: drawn today HIV: 1999 with pregnancy Labs: PCP takes care of labs  Urine: Trace RBC UPT: negative   reports that she has never smoked. She has never used smokeless tobacco. She reports that she drinks about 0.6 - 1.2 oz of alcohol per week . She reports that she does not use drugs.  Past Medical History:  Diagnosis Date  . Attention deficit disorder of adult   . Menorrhagia   . Microscopic hematuria    neg uro workup, Dr. Aldean AstKimbrough  . PMS (premenstrual syndrome)    symptoms better on Prozac    Past Surgical History:  Procedure Laterality Date  . ENDOMETRIAL ABLATION  09/20/05   HTA  . OVARIAN CYST REMOVAL  age 50    Current Outpatient Prescriptions  Medication Sig Dispense Refill  . cetirizine (ZYRTEC) 10 MG tablet Take 10 mg by mouth daily.    Marland Kitchen. FLUoxetine (PROZAC) 20 MG capsule TAKE 1 CAPSULE (20 MG TOTAL) BY MOUTH DAILY. 90 capsule 3  . methylphenidate (CONCERTA) 36 MG CR tablet Take 36 mg by  mouth every morning.    . Multiple Vitamin (MULTIVITAMIN) tablet Take 1 tablet by mouth daily.    . Naproxen Sodium (ALEVE) 220 MG CAPS Take by mouth as needed.     No current facility-administered medications for this visit.     Family History  Problem Relation Age of Onset  . Breast cancer Mother 5358  . Hypertension Mother   . Hyperlipidemia Mother   . Osteoarthritis Mother   . Diabetes Mother   . Breast cancer Maternal Aunt     postmeno  . Breast cancer Maternal Aunt     postmeno  . Diabetes Maternal Aunt   . Diabetes Maternal Grandfather     ROS:  Pertinent items are noted in HPI.  Otherwise, a comprehensive ROS was negative.  Exam:   BP 110/72 (BP Location: Right Arm, Patient Position: Sitting, Cuff Size: Normal)   Pulse 72   Ht 5' 2.75" (1.594 m)   Wt 143 lb (64.9 kg)   LMP 07/02/2016 (Exact Date)   BMI 25.53 kg/m  Height: 5' 2.75" (159.4 cm) Ht Readings from Last 3 Encounters:  08/02/16 5' 2.75" (1.594 m)  07/07/15 5' 4.75" (1.645 m)  06/24/14 5\' 5"  (1.651 m)    General appearance: alert, cooperative and appears stated age Head: Normocephalic, without obvious  abnormality, atraumatic Neck: no adenopathy, supple, symmetrical, trachea midline and thyroid normal to inspection and palpation Lungs: clear to auscultation bilaterally Breasts: normal appearance, no masses or tenderness Heart: regular rate and rhythm Abdomen: soft, non-tender; no masses,  no organomegaly Extremities: extremities normal, atraumatic, no cyanosis or edema Skin: Skin color, texture, turgor normal. No rashes or lesions Lymph nodes: Cervical, supraclavicular, and axillary nodes normal. No abnormal inguinal nodes palpated Neurologic: Grossly normal   Pelvic: External genitalia:  no lesions              Urethra:  normal appearing urethra with no masses, tenderness or lesions              Bartholin's and Skene's: normal                 Vagina: normal appearing vagina with normal color and  discharge, no lesions              Cervix: anteverted              Pap taken: No. Bimanual Exam:  Uterus:  normal size, contour, position, consistency, mobility, non-tender              Adnexa: no mass, fullness, tenderness               Rectovaginal: Confirms               Anus:  normal sphincter tone, no lesions  Chaperone present: yes  A:  Well Woman with normal exam   Normal menses - currently late X 10 days S/P HTA ablation 08/2005, husband vasectomy History of PMS does well on SSRI History of microalbinuria History of ADD [facility]: breast cancer - mother and 2 MA, 1 maternal first cousin    P:   Reviewed health and wellness pertinent to exam  Pap smear as above  Mammogram is due first of January  Will get genetic counselor consult and follow  Follow with labs  Provera 5 mg X 5 days to start this cycle  Counseled on breast self exam, mammography screening, adequate intake of calcium and vitamin D, diet and exercise, Kegel's exercises return annually or prn  An After Visit Summary was printed and given to the patient.

## 2016-08-03 LAB — URINALYSIS, MICROSCOPIC ONLY
BACTERIA UA: NONE SEEN [HPF]
CASTS: NONE SEEN [LPF]
CRYSTALS: NONE SEEN [HPF]
SQUAMOUS EPITHELIAL / LPF: NONE SEEN [HPF] (ref <=5)
WBC, UA: NONE SEEN WBC/HPF (ref <=5)
YEAST: NONE SEEN [HPF]

## 2016-08-03 LAB — FOLLICLE STIMULATING HORMONE: FSH: 63.5 m[IU]/mL

## 2016-08-03 LAB — VITAMIN D 25 HYDROXY (VIT D DEFICIENCY, FRACTURES): VIT D 25 HYDROXY: 38 ng/mL (ref 30–100)

## 2016-08-03 LAB — HEPATITIS C ANTIBODY: HCV AB: NEGATIVE

## 2016-08-03 LAB — TSH: TSH: 0.48 mIU/L

## 2016-08-03 NOTE — Progress Notes (Signed)
Encounter reviewed by Dr. Brook Amundson C. Silva.  

## 2016-08-06 ENCOUNTER — Telehealth: Payer: Self-pay | Admitting: Genetic Counselor

## 2016-08-06 ENCOUNTER — Telehealth: Payer: Self-pay | Admitting: Nurse Practitioner

## 2016-08-06 ENCOUNTER — Encounter: Payer: Self-pay | Admitting: Genetic Counselor

## 2016-08-06 NOTE — Telephone Encounter (Signed)
Returned call to pt and discussed [facility]. She may or may not get a withdrawal bleeding with the Provera.  She is to let us know either way,.

## 2016-08-06 NOTE — Telephone Encounter (Signed)
Pt is called - see PC note. Encounter is closed.

## 2016-08-06 NOTE — Telephone Encounter (Signed)
Returned call to patient. Patient states she requested call back from Ria CommentPatricia Grubb, NP in reference to one of recent labs. Advised patient will forward message. Advised Ria CommentPatricia Grubb, NP is seeing patients, response may not be immediate. Patient is agreeable.   Notes Recorded by Ria CommentPatricia Grubb, FNP on 08/03/2016 at 7:49 AM EST Results via my chart:  Misty StanleyLisa, [facility] is a little higher than I expected at 63.5 - you may not get a cycle from the Provera but call and let us know if you do and if bleeding was heavy or light. The Hep C was negative as expected. Vit D was at 38 compared to 53 in the past. Our goal is to get you to about 50 - you may want to take OTC Vit D 1000 IU daily. The thyroid, CBC, and urine micro was very normal.

## 2016-08-06 NOTE — Telephone Encounter (Signed)
Patient received lab results over MYchart and would like to speak with nurse.

## 2016-08-06 NOTE — Telephone Encounter (Signed)
Pt confirmed appt, verified demo and insurance, mailed pt letter, in basket referring provider appt date/time. °

## 2016-08-09 ENCOUNTER — Other Ambulatory Visit: Payer: Self-pay | Admitting: Nurse Practitioner

## 2016-08-09 DIAGNOSIS — Z1231 Encounter for screening mammogram for malignant neoplasm of breast: Secondary | ICD-10-CM

## 2016-08-20 ENCOUNTER — Telehealth: Payer: Self-pay | Admitting: Nurse Practitioner

## 2016-08-20 NOTE — Telephone Encounter (Signed)
Let her know that this information is very helpful.  Keep a menses calendar and again if 2-3 months late to call.  i'm glad she got such a good menses to clean out the endo lining.

## 2016-08-20 NOTE — Telephone Encounter (Signed)
Spoke with patient. Advised of message as seen below from [name], FNP. Patient is agreeable and verbalizes understanding.  Routing to provider for final review. Patient agreeable to disposition. Will close encounter.  

## 2016-08-20 NOTE — Telephone Encounter (Signed)
Spoke with patient. Patient states she was sen in office 12/4 and started provera x 5 days. Patient states she took last pill on 12/8. Patient reports cycle started 12/12, a little heavier than normal and lasted a little longer. Patient states blood during cycles is usually dark, reports this cycle was a much brighter red. Advised patient will update Ria CommentPatricia Grubb, NP and return call with any recommendations. Patient is agreeable.  Ria CommentPatricia Grubb, NP -please advise?

## 2016-08-20 NOTE — Telephone Encounter (Signed)
Patient came in to give and update on a medication to start her cycle. She said, "I started and it was a little heavier than normal but that is about it."

## 2016-09-10 ENCOUNTER — Ambulatory Visit
Admission: RE | Admit: 2016-09-10 | Discharge: 2016-09-10 | Disposition: A | Discharge status: Home / Self Care | Payer: 59 | Source: Ambulatory Visit | Attending: Nurse Practitioner | Admitting: Nurse Practitioner

## 2016-09-10 DIAGNOSIS — Z1231 Encounter for screening mammogram for malignant neoplasm of breast: Secondary | ICD-10-CM

## 2016-09-13 ENCOUNTER — Telehealth: Payer: Self-pay | Admitting: Genetic Counselor

## 2016-09-13 NOTE — Telephone Encounter (Signed)
Lt vm regarding rescheduleing 01/22 genetics appt.

## 2016-09-20 ENCOUNTER — Other Ambulatory Visit: Payer: 59

## 2016-09-20 ENCOUNTER — Encounter: Payer: 59 | Admitting: Genetic Counselor

## 2016-09-27 ENCOUNTER — Other Ambulatory Visit: Payer: 59

## 2016-09-27 ENCOUNTER — Telehealth: Payer: Self-pay | Admitting: Genetic Counselor

## 2016-09-27 NOTE — Telephone Encounter (Signed)
Pt call in to reschedule 01/29 to 2/13 due to being ill

## 2016-10-12 ENCOUNTER — Ambulatory Visit (HOSPITAL_BASED_OUTPATIENT_CLINIC_OR_DEPARTMENT_OTHER): Payer: 59

## 2016-10-12 ENCOUNTER — Other Ambulatory Visit: Payer: 59

## 2016-10-12 DIAGNOSIS — Z315 Encounter for genetic counseling: Secondary | ICD-10-CM

## 2016-10-12 DIAGNOSIS — Z803 Family history of malignant neoplasm of breast: Secondary | ICD-10-CM

## 2016-10-12 DIAGNOSIS — Z808 Family history of malignant neoplasm of other organs or systems: Secondary | ICD-10-CM | POA: Insufficient documentation

## 2016-10-12 DIAGNOSIS — Z809 Family history of malignant neoplasm, unspecified: Secondary | ICD-10-CM

## 2016-10-12 NOTE — Progress Notes (Signed)
REFERRING PROVIDER: Ria CommentPatricia Grubb, FNP [address] [place] [id number]  PRIMARY PROVIDER:  No PCP Per Patient  PRIMARY REASON FOR VISIT:  1. Family history of malignant neoplasm of breast   2. Family history of malignant melanoma      HISTORY OF PRESENT ILLNESS:   [name], a 51 y.o. female, was seen for a [facility] cancer genetics consultation at the request of Dr. Berneice GandyGrubb due to a family history of cancer.  [name] presents to clinic today to discuss the possibility of a hereditary predisposition to cancer, genetic testing, and to further clarify her future cancer risks, as well as potential cancer risks for family members.   [name] is a 51 y.o. female with no personal history of cancer. She has annual mammograms but no breast MRIs. She has PAP tests every 3 years with no abnormal results. First colonoscopy at 48 had no polyps. Ovaries and uterus are intact.  CANCER HISTORY:   No history exists.     HORMONAL RISK FACTORS:  Menarche was at age 51 .  First live birth at age 51.  OCP use for approximately not assessed years.  Ovaries intact: yes.  Hysterectomy: no.  Menopausal status: premenopausal.  HRT use: 0 years. Colonoscopy: yes; normal. Mammogram within the last year: yes. Number of breast biopsies: 0. Up to date with pelvic exams:  yes. Any excessive radiation exposure in the past:  no  Past Medical History:  Diagnosis Date  . Attention deficit disorder of adult   . Menorrhagia   . Microscopic hematuria    neg uro workup, Dr. Aldean AstKimbrough  . PMS (premenstrual syndrome)    symptoms better on Prozac    Past Surgical History:  Procedure Laterality Date  . ENDOMETRIAL ABLATION  09/20/05   HTA  . OVARIAN CYST REMOVAL  age 51    Social History   Social History  . Marital status: Married    Spouse name: N/A  . Number of children: 3  . Years of education: N/A   Social History Main Topics  . Smoking status: Never Smoker  .  Smokeless tobacco: Never Used  . Alcohol use 0.6 - 1.2 oz/week    1 - 2 Standard drinks or equivalent per week  . Drug use: No  . Sexual activity: Yes    Partners: Male    Birth control/ protection: Surgical     Comment: vasectomy   Other Topics Concern  . Not on file   Social History Narrative  . No narrative on file     FAMILY HISTORY:  We obtained a detailed, 4-generation family history.  Significant diagnoses are listed below: Family History  Problem Relation Age of Onset  . Hypertension Mother   . Hyperlipidemia Mother   . Osteoarthritis Mother   . Diabetes Mother   . Breast cancer Mother [id number]  . Cancer Father   . Melanoma Father     6560s, multiple  . Breast cancer Maternal Aunt     postmeno  . Breast cancer Maternal Aunt     postmeno  . Diabetes Maternal Aunt   . Diabetes Maternal Grandfather   . Breast cancer Maternal Grandmother     diagnosed in 90s  . Melanoma Paternal Uncle     460s, melanoma with mets to brain  . Breast cancer Cousin     diagnosed in 6360s    [name] is unaware of previous family history of genetic testing for hereditary cancer risks.  Patient's maternal ancestors are of possible [place] descent, and paternal ancestors are of Caucasian [place] descent. There is no reported Ashkenazi Jewish ancestry. There is no known consanguinity.  GENETIC COUNSELING ASSESSMENT: SHAKHIA GRAMAJO is a 51 y.o. female with a family history which is somewhat suggestive of a hereditary predisposition to cancer. We, therefore, discussed and recommended the following at today's visit.   DISCUSSION: We reviewed the characteristics, features and inheritance patterns of hereditary cancer syndromes. We also discussed genetic testing, including the appropriate family members to test, the process of testing, insurance coverage and turn-around-time for results. We discussed that testing would be best started with her mother given her personal history of cancer. Ms. Hearty stated that she  would talk to her mother about getting the testing done before she pursues it herself.  Based on the patient's personal and family history, statistical models (Tyrer-Cuzick)  and literature data were used to estimate her risk of developing breast cancer. These estimate her lifetime risk of developing breast cancer to be approximately 32.7%. This estimation does not take into account any genetic testing results.  The patient's lifetime breast cancer risk is a preliminary estimate based on available information using one of several models endorsed by the American Cancer Society (ACS). The ACS recommends consideration of breast MRI screening as an adjunct to mammography for patients at high risk (defined as 20% or greater lifetime risk). A more detailed breast cancer risk assessment can be considered, if clinically indicated.   Ms. Tesar has been determined to be at high risk for breast cancer.  Therefore, we recommend that annual screening with mammography and breast MRI.  Ms. Tristan should discuss her individual situation with her referring physician and determine a breast cancer screening plan with which they are both comfortable.    PLAN: Based on Ms. Cashwell's family history, we recommended her mother, who was diagnosed with breast cancer at age 76, have genetic counseling and testing. Ms. Solazzo will let us know if we can be of any assistance in coordinating genetic counseling and/or testing for this family member.   Lastly, we encouraged Ms. Sweetland to remain in contact with cancer genetics annually so that we can continuously update the family history and inform her of any changes in cancer genetics and testing that may be of benefit for this family.   Ms.  Sellin questions were answered to her satisfaction today. Our contact information was provided should additional questions or concerns arise. Thank you for the referral and allowing [place] to share in the care of your patient.   Joanna Hews, MS,  [facility] Certified Genetic Counselor  The patient was seen for a total of 45 minutes in face-to-face genetic counseling.  This patient was discussed with Drs. Magrinat, Pamelia Hoit and/or Mosetta Putt who agrees with the above.    _______________________________________________________________________ For Office Staff:  Number of people involved in session: 3 Was an Intern/ student involved with case: no

## 2016-11-09 ENCOUNTER — Telehealth: Payer: Self-pay | Admitting: Nurse Practitioner

## 2016-11-09 NOTE — Telephone Encounter (Signed)
Spoke with patient. Patient states she has experienced headaches with cycles when she was younger and now again. Patient reports headaches for last 4-5 months either right before cycle, during or right after. Patient states sometimes even migraines, no aura. Patient states sometimes Excedrin works, but does not like to take medication with caffeine. Patient asking for medication for headaches, is this is something Sabrina CommentPatricia Grubb, NP can prescribe? Advised patient would review with Sabrina CommentPatricia Grubb, NP and return call with recommendations. Patient is agreeable.  Sabrina CommentPatricia Grubb, NP -please advise? Last OV 08/02/16.

## 2016-11-09 NOTE — Telephone Encounter (Signed)
Spoke with patient, advised as seen below per Ria CommentPatricia Grubb, NP. Patient declines referral to neurology at this time. Patient states she will continue to take Excedrin. Patient verbalizes understanding.  Routing to provider for final review. Patient is agreeable to disposition. Will close encounter.

## 2016-11-09 NOTE — Telephone Encounter (Signed)
We would have her see neurologist at this time to make sure nothing else is wrong.

## 2016-11-09 NOTE — Telephone Encounter (Signed)
Patient would like something called in for her headaches.  She only gets them when she is on her period.

## 2017-08-03 ENCOUNTER — Ambulatory Visit (INDEPENDENT_AMBULATORY_CARE_PROVIDER_SITE_OTHER): Payer: 59 | Admitting: Obstetrics and Gynecology

## 2017-08-03 ENCOUNTER — Ambulatory Visit: Payer: 59 | Admitting: Nurse Practitioner

## 2017-08-03 ENCOUNTER — Encounter: Payer: Self-pay | Admitting: Obstetrics and Gynecology

## 2017-08-03 ENCOUNTER — Other Ambulatory Visit: Payer: Self-pay

## 2017-08-03 VITALS — BP 122/78 | HR 84 | Resp 14 | Ht 64.75 in | Wt 143.0 lb

## 2017-08-03 DIAGNOSIS — Z01419 Encounter for gynecological examination (general) (routine) without abnormal findings: Secondary | ICD-10-CM | POA: Diagnosis not present

## 2017-08-03 DIAGNOSIS — Z803 Family history of malignant neoplasm of breast: Secondary | ICD-10-CM | POA: Diagnosis not present

## 2017-08-03 DIAGNOSIS — Z0000 Encounter for general adult medical examination without abnormal findings: Secondary | ICD-10-CM

## 2017-08-03 MED ORDER — MEDROXYPROGESTERONE ACETATE 5 MG PO TABS
ORAL_TABLET | ORAL | 1 refills | Status: DC
Start: 2017-08-03 — End: 2018-09-27

## 2017-08-03 NOTE — Patient Instructions (Signed)

## 2017-08-03 NOTE — Progress Notes (Signed)
51 y.o. [id number] MarriedCaucasianF here for annual exam.   She has a strong FH of breast cancer, including mother, aunts x 2, MGM, and a cousin. Her Mother is going to get Genetic testing.  Cycles were monthly through August, skipped September, then had a cycle at the end of October. Only bleeds x 2 days, light. Had an ablation 10 years.  She was having hot flashes for a week in September. Then they went away. Sexually active, no pain. She has a low libido, on Prozac.  H/O microscopic hematuria, negative urology evaluation.    Period Duration (Days): 2 days  Period Pattern: (!) Irregular Menstrual Flow: Light Menstrual Control: Thin pad Dysmenorrhea: None  Patient's last menstrual period was 06/21/2017.          Sexually active: Yes.    The current method of family planning is vasectomy.    Exercising: Yes.    tennis/walking Smoker:  no  Health Maintenance: Pap:  07-17-15 WNL NEG HR HPV 05-22-12 WNL NEG HR HPV History of abnormal Pap:  no MMG:  09-10-16 WNL  Colonoscopy:  09-09-14 WNL repeat in 10 yrs BMD:   Never TDaP:  06-05-13 Gardasil: N/A   reports that  has never smoked. she has never used smokeless tobacco. She reports that she drinks about 0.6 - 1.2 oz of alcohol per week. She reports that she does not use drugs. 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 Oldest just got married. Other 2 college, 2 daughters and one son.    Past Medical History:  Diagnosis Date  . Attention deficit disorder of adult   . Menorrhagia   . Microscopic hematuria    neg uro workup, Dr. Aldean AstKimbrough  . PMS (premenstrual syndrome)    symptoms better on Prozac    Past Surgical History:  Procedure Laterality Date  . ENDOMETRIAL ABLATION  09/20/05   HTA  . OVARIAN CYST REMOVAL  age 51    Current Outpatient Medications  Medication Sig Dispense Refill  . FLUoxetine (PROZAC) 20 MG capsule TAKE 1 CAPSULE (20 MG TOTAL) BY MOUTH DAILY. 90 capsule 3  . methylphenidate (CONCERTA) 36 MG CR tablet Take 36  mg by mouth every morning.    . Multiple Vitamin (MULTIVITAMIN) tablet Take 1 tablet by mouth daily.    . Naproxen Sodium (ALEVE) 220 MG CAPS Take by mouth as needed.     No current facility-administered medications for this visit.     Family History  Problem Relation Age of Onset  . Hypertension Mother   . Hyperlipidemia Mother   . Osteoarthritis Mother   . Diabetes Mother   . Breast cancer Mother [id number]  . Cancer Father   . Melanoma Father        2760s, multiple  . Breast cancer Maternal Aunt        postmeno  . Breast cancer Maternal Aunt        postmeno  . Diabetes Maternal Aunt   . Diabetes Maternal Grandfather   . Breast cancer Maternal Grandmother        diagnosed in 90s  . Melanoma Paternal Uncle        4560s, melanoma with mets to brain  . Breast cancer Cousin        diagnosed in 2460s    Review of Systems  Constitutional: Negative.   HENT: Negative.   Eyes: Negative.   Respiratory: Negative.   Cardiovascular: Negative.   Gastrointestinal: Negative.   Endocrine: Negative.   Genitourinary:  Negative.   Musculoskeletal: Negative.   Skin: Negative.   Allergic/Immunologic: Negative.   Neurological: Negative.   Psychiatric/Behavioral: Negative.     Exam:   BP 122/78 (BP Location: Right Arm, Patient Position: Sitting, Cuff Size: Normal)   Pulse 84   Resp 14   Ht 5' 4.75" (1.645 m)   Wt 143 lb (64.9 kg)   LMP 06/21/2017   BMI 23.98 kg/m   Weight change: @WEIGHTCHANGE @ Height:   Height: 5' 4.75" (164.5 cm)  Ht Readings from Last 3 Encounters:  08/03/17 5' 4.75" (1.645 m)  08/02/16 5' 2.75" (1.594 m)  07/07/15 5' 4.75" (1.645 m)    General appearance: alert, cooperative and appears stated age Head: Normocephalic, without obvious abnormality, atraumatic Neck: no adenopathy, supple, symmetrical, trachea midline and thyroid normal to inspection and palpation Lungs: clear to auscultation bilaterally Cardiovascular: regular rate and rhythm Breasts: normal appearance,  no masses or tenderness Abdomen: soft, non-tender; non distended,  no masses,  no organomegaly Extremities: extremities normal, atraumatic, no cyanosis or edema Skin: Skin color, texture, turgor normal. No rashes or lesions Lymph nodes: Cervical, supraclavicular, and axillary nodes normal. No abnormal inguinal nodes palpated Neurologic: Grossly normal   Pelvic: External genitalia:  no lesions              Urethra:  normal appearing urethra with no masses, tenderness or lesions              Bartholins and Skenes: normal                 Vagina: normal appearing vagina with normal color and discharge, no lesions              Cervix: no lesions               Bimanual Exam:  Uterus:  normal size, contour, position, consistency, mobility, non-tender              Adnexa: no mass, fullness, tenderness               Rectovaginal: Confirms               Anus:  normal sphincter tone, no lesions  Chaperone was present for exam.  A:  Well Woman with normal exam  Family history of breast cancer in her mother, 2 Maunts and 1 maternal 1st cousin  Her risk of breast cancer is 32.7% (has seen genetics), prior to genetic testing  P:   No pap this year  Mammogram in 1/19  Will order a breast MRI in 7/19  Mother with get genetic testing, she will get testing depending on the results.  Discussed breast self exam  Discussed calcium and vit D intake  Colonoscopy UTD  Screening labs  Information on Libido given

## 2017-08-04 ENCOUNTER — Telehealth: Payer: Self-pay | Admitting: *Deleted

## 2017-08-04 ENCOUNTER — Other Ambulatory Visit: Payer: Self-pay | Admitting: *Deleted

## 2017-08-04 DIAGNOSIS — Z803 Family history of malignant neoplasm of breast: Secondary | ICD-10-CM

## 2017-08-04 DIAGNOSIS — Z9189 Other specified personal risk factors, not elsewhere classified: Secondary | ICD-10-CM

## 2017-08-04 LAB — LIPID PANEL
CHOL/HDL RATIO: 2.4 ratio (ref 0.0–4.4)
CHOLESTEROL TOTAL: 200 mg/dL — AB (ref 100–199)
HDL: 84 mg/dL (ref >39)
LDL Calculated: 91 mg/dL (ref 0–99)
TRIGLYCERIDES: 123 mg/dL (ref 0–149)
VLDL Cholesterol Cal: 25 mg/dL (ref 5–40)

## 2017-08-04 LAB — COMPREHENSIVE METABOLIC PANEL
A/G RATIO: 1.6 (ref 1.2–2.2)
ALK PHOS: 52 IU/L (ref 39–117)
ALT: 8 IU/L (ref 0–32)
AST: 17 IU/L (ref 0–40)
Albumin: 4.4 g/dL (ref 3.5–5.5)
BUN/Creatinine Ratio: 14 (ref 9–23)
BUN: 11 mg/dL (ref 6–24)
Bilirubin Total: <0.2 mg/dL (ref 0.0–1.2)
CO2: 26 mmol/L (ref 20–29)
Calcium: 9.3 mg/dL (ref 8.7–10.2)
Chloride: 102 mmol/L (ref 96–106)
Creatinine, Ser: 0.79 mg/dL (ref 0.57–1.00)
GFR calc Af Amer: 99 mL/min/{1.73_m2} (ref >59)
GFR, EST NON AFRICAN AMERICAN: 86 mL/min/{1.73_m2} (ref >59)
GLOBULIN, TOTAL: 2.8 g/dL (ref 1.5–4.5)
Glucose: 85 mg/dL (ref 65–99)
POTASSIUM: 4 mmol/L (ref 3.5–5.2)
SODIUM: 141 mmol/L (ref 134–144)
Total Protein: 7.2 g/dL (ref 6.0–8.5)

## 2017-08-04 LAB — CBC
Hematocrit: 37.4 % (ref 34.0–46.6)
Hemoglobin: 12.6 g/dL (ref 11.1–15.9)
MCH: 30.7 pg (ref 26.6–33.0)
MCHC: 33.7 g/dL (ref 31.5–35.7)
MCV: 91 fL (ref 79–97)
PLATELETS: 370 10*3/uL (ref 150–379)
RBC: 4.11 x10E6/uL (ref 3.77–5.28)
RDW: 13.6 % (ref 12.3–15.4)
WBC: 7.2 10*3/uL (ref 3.4–10.8)

## 2017-08-04 NOTE — Telephone Encounter (Signed)
-----   Message from Romualdo BolkJill Evelyn Jertson, MD sent at 08/03/2017  5:28 PM EST ----- This patient has an increased risk of breast cancer. Please set her up for a breast MRI in 7/19 (want to do it 6 months after her mammogram). Thanks, Noreene LarssonJill

## 2017-08-04 NOTE — Telephone Encounter (Signed)
Spoke with patient. Advised future order placed for bilateral breast MRI for 02/2018. MRI will be precerted by our office prior to scheduling, [facility] will contact [facility] directly to schedule.   Patient states she is in her current insurance plan until March 2019, asking if MRI can be done in January and [facility] in July, deductible will be met? Advised patient screening MMG would have to be completed in January, MRI  6 months later. Advised Dr. Talbert Nan will review, will return call if any additional recommendations.   Patient has additional questions regarding insurance coverage, advised patient will forward message to [facility] in our benefits department for return call, patient is agreeable.    Routing to [place] D.   Cc: Dr. Talbert Nan

## 2017-08-05 NOTE — Telephone Encounter (Signed)
She can do the MRI early if she wants, there is just some benefit to having imaging every 6 months.

## 2017-08-09 NOTE — Telephone Encounter (Signed)
Spoke with patient, advised as seen below per Dr. Oscar LaJertson. Patient states she would like to proceed with scheduling breast MRI before end of February 2019. Advised patient will forward to [place] in our insurance and benefits department for precert. [facility] will contact for scheduling. Patient verbalizes understanding and is agreeable.   New order placed.  Routing to provider for final review. Patient is agreeable to disposition. Will close encounter.

## 2017-08-26 ENCOUNTER — Other Ambulatory Visit: Payer: Self-pay | Admitting: Obstetrics and Gynecology

## 2017-08-26 DIAGNOSIS — Z1231 Encounter for screening mammogram for malignant neoplasm of breast: Secondary | ICD-10-CM

## 2017-09-21 ENCOUNTER — Ambulatory Visit
Admission: RE | Admit: 2017-09-21 | Discharge: 2017-09-21 | Disposition: A | Discharge status: Home / Self Care | Payer: 59 | Source: Ambulatory Visit | Attending: Obstetrics and Gynecology | Admitting: Obstetrics and Gynecology

## 2017-09-21 DIAGNOSIS — Z1231 Encounter for screening mammogram for malignant neoplasm of breast: Secondary | ICD-10-CM

## 2017-09-22 ENCOUNTER — Other Ambulatory Visit: Payer: Self-pay | Admitting: Obstetrics and Gynecology

## 2017-09-22 DIAGNOSIS — R928 Other abnormal and inconclusive findings on diagnostic imaging of breast: Secondary | ICD-10-CM

## 2017-09-26 ENCOUNTER — Ambulatory Visit
Admission: RE | Admit: 2017-09-26 | Discharge: 2017-09-26 | Disposition: A | Discharge status: Home / Self Care | Payer: 59 | Source: Ambulatory Visit | Attending: Obstetrics and Gynecology | Admitting: Obstetrics and Gynecology

## 2017-09-26 DIAGNOSIS — R928 Other abnormal and inconclusive findings on diagnostic imaging of breast: Secondary | ICD-10-CM

## 2018-02-06 ENCOUNTER — Ambulatory Visit
Admission: RE | Admit: 2018-02-06 | Discharge: 2018-02-06 | Disposition: A | Discharge status: Home / Self Care | Payer: 59 | Source: Ambulatory Visit | Attending: Obstetrics and Gynecology | Admitting: Obstetrics and Gynecology

## 2018-02-06 DIAGNOSIS — Z9189 Other specified personal risk factors, not elsewhere classified: Secondary | ICD-10-CM

## 2018-02-06 DIAGNOSIS — Z803 Family history of malignant neoplasm of breast: Secondary | ICD-10-CM

## 2018-02-06 MED ORDER — GADOBENATE DIMEGLUMINE 529 MG/ML IV SOLN
13.0000 mL | Freq: Once | INTRAVENOUS | Status: AC | PRN
  Administered 2018-02-06: 13 mL via INTRAVENOUS

## 2018-03-16 ENCOUNTER — Telehealth: Payer: Self-pay | Admitting: Obstetrics and Gynecology

## 2018-03-16 NOTE — Telephone Encounter (Signed)
Patient was told to call if she did not have a cycle in a few months. Patient cannot remember how many months she was supposed to wait before informing Dr.Jertson.

## 2018-03-16 NOTE — Telephone Encounter (Signed)
Spoke with patient. Has not had a cycle in two months. Wanted to confirm she should take Provera 5 mg this month.  Advised yes, to take Provera as directed. Advised to continue with provera every other month if no menses.  Questions answered and patient agreeable.  Will call back with any concerns or if provider has any additional instructions. Patient agreeable.

## 2018-08-09 ENCOUNTER — Ambulatory Visit: Payer: 59 | Admitting: Obstetrics and Gynecology

## 2018-09-26 NOTE — Progress Notes (Signed)
53 y.o. [id number] Married White or Caucasian Not Hispanic or Latino female here for annual exam.  Would like to have lab work done today. She has a strong family history or breast cancer. Her mother is planning on getting genetic testing.  Her risk of breast cancer without genetic testing is 32.7% H/O endometrial ablation. Sexually active, no pain. No vasomotor symptoms, mild vaginal dryness.   Period Duration (Days): 1-2 days Period Pattern: (!) Irregular(having every 3-4 months) Menstrual Flow: Light Menstrual Control: Panty liner Menstrual Control Change Freq (Hours): changing panty liner every 8 hours Dysmenorrhea: None  She has provera to take if needed   No LMP recorded. LMP in 06/2018 per patient.        Sexually active: Yes.    The current method of family planning is post menopausal status and spouse with a vasectomy.    Exercising: Yes.    tennis Smoker:  No  Health Maintenance: Pap:  07-17-15 WNL NEG HR HPV 05-22-12 WNL NEG HR HPV History of abnormal Pap:  no MMG:  09/21/2017 Birads 0, 09/26/2017 Birads 2 benign, Breast MRI 02/06/2018 Birads 1 negative Colonoscopy:  09-09-14 WNL repeat in 10 yrs BMD:   Never TDaP:  06-05-13 Gardasil: N/A   reports that she has never smoked. She has never used smokeless tobacco. She reports current alcohol use of about 1.0 standard drinks of alcohol per week. She reports that she does not use drugs. 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  Past Medical History:  Diagnosis Date  . Attention deficit disorder of adult   . Menorrhagia   . Microscopic hematuria    neg uro workup, Dr. Aldean Ast  . PMS (premenstrual syndrome)    symptoms better on Prozac    Past Surgical History:  Procedure Laterality Date  . ENDOMETRIAL ABLATION  09/20/05   HTA  . OVARIAN CYST REMOVAL  age 29    Current Outpatient Medications  Medication Sig Dispense Refill  . Cyanocobalamin (VITAMIN B 12 PO) Take by mouth.    . medroxyPROGESTERone (PROVERA) 5 MG  tablet Take one tablet po qd x 5 days every other month if no spontaneous menses 15 tablet 1  . methylphenidate (CONCERTA) 36 MG CR tablet Take 36 mg by mouth every morning.    . Multiple Vitamin (MULTIVITAMIN) tablet Take 1 tablet by mouth daily.    . Naproxen Sodium (ALEVE) 220 MG CAPS Take by mouth as needed.    Marland Kitchen VITAMIN D PO Take by mouth.     No current facility-administered medications for this visit.     Family History  Problem Relation Age of Onset  . Hypertension Mother   . Hyperlipidemia Mother   . Osteoarthritis Mother   . Diabetes Mother   . Breast cancer Mother 20  . Cancer Father   . Melanoma Father        82s, multiple  . Breast cancer Maternal Aunt        postmeno  . Breast cancer Maternal Aunt        postmeno  . Diabetes Maternal Aunt   . Diabetes Maternal Grandfather   . Breast cancer Maternal Grandmother        diagnosed in 90s  . Melanoma Paternal Uncle        107s, melanoma with mets to brain  . Breast cancer Cousin        diagnosed in 55s    Review of Systems  Constitutional: Negative.   HENT: Negative.  Eyes: Negative.   Respiratory: Negative.   Cardiovascular: Negative.   Gastrointestinal: Negative.   Endocrine: Negative.   Genitourinary: Negative.   Musculoskeletal: Negative.   Skin: Negative.   Allergic/Immunologic: Negative.   Neurological: Negative.   Hematological: Negative.   Psychiatric/Behavioral: Negative.     Exam:   BP 108/60 (BP Location: Right Arm, Patient Position: Sitting, Cuff Size: Normal)   Pulse 72   Ht 5' 5.5" (1.664 m)   Wt 150 lb 3.2 oz (68.1 kg)   BMI 24.61 kg/m   Weight change: @WEIGHTCHANGE @ Height:   Height: 5' 5.5" (166.4 cm)  Ht Readings from Last 3 Encounters:  09/27/18 5' 5.5" (1.664 m)  08/03/17 5' 4.75" (1.645 m)  08/02/16 5' 2.75" (1.594 m)    General appearance: alert, cooperative and appears stated age Head: Normocephalic, without obvious abnormality, atraumatic Neck: no adenopathy, supple,  symmetrical, trachea midline and thyroid normal to inspection and palpation Lungs: clear to auscultation bilaterally Cardiovascular: regular rate and rhythm Breasts: normal appearance, no masses or tenderness Abdomen: soft, non-tender; non distended,  no masses,  no organomegaly Extremities: extremities normal, atraumatic, no cyanosis or edema Skin: Skin color, texture, turgor normal. No rashes or lesions Lymph nodes: Cervical, supraclavicular, and axillary nodes normal. No abnormal inguinal nodes palpated Neurologic: Grossly normal   Pelvic: External genitalia:  no lesions              Urethra:  normal appearing urethra with no masses, tenderness or lesions              Bartholins and Skenes: normal                 Vagina: normal appearing vagina with normal color and discharge, no lesions              Cervix: no lesions               Bimanual Exam:  Uterus:  normal size, contour, position, consistency, mobility, non-tender              Adnexa: no mass, fullness, tenderness               Rectovaginal: Confirms               Anus:  normal sphincter tone, no lesions  Chaperone was present for exam.  A:  Well Woman with normal exam  Vit d def  Strong FH of breast cancer, she has a 32.7% risk of breast cancer  P:   Pap with hpv  Discussed breast self exam  Discussed calcium and vit D intake  Cyclic provera  Screening labs, vit d  Mammogram in a few weeks  Breast MRI in the summer

## 2018-09-27 ENCOUNTER — Encounter: Payer: Self-pay | Admitting: Obstetrics and Gynecology

## 2018-09-27 ENCOUNTER — Other Ambulatory Visit (HOSPITAL_COMMUNITY)
Admission: RE | Admit: 2018-09-27 | Discharge: 2018-09-27 | Disposition: A | Discharge status: Home / Self Care | Payer: 59 | Source: Ambulatory Visit | Attending: Obstetrics and Gynecology | Admitting: Obstetrics and Gynecology

## 2018-09-27 ENCOUNTER — Other Ambulatory Visit: Payer: Self-pay

## 2018-09-27 ENCOUNTER — Ambulatory Visit (INDEPENDENT_AMBULATORY_CARE_PROVIDER_SITE_OTHER): Payer: 59 | Admitting: Obstetrics and Gynecology

## 2018-09-27 VITALS — BP 108/60 | HR 72 | Ht 65.5 in | Wt 150.2 lb

## 2018-09-27 DIAGNOSIS — N951 Menopausal and female climacteric states: Secondary | ICD-10-CM | POA: Diagnosis not present

## 2018-09-27 DIAGNOSIS — E559 Vitamin D deficiency, unspecified: Secondary | ICD-10-CM

## 2018-09-27 DIAGNOSIS — N926 Irregular menstruation, unspecified: Secondary | ICD-10-CM | POA: Diagnosis not present

## 2018-09-27 DIAGNOSIS — Z124 Encounter for screening for malignant neoplasm of cervix: Secondary | ICD-10-CM | POA: Insufficient documentation

## 2018-09-27 DIAGNOSIS — Z01419 Encounter for gynecological examination (general) (routine) without abnormal findings: Secondary | ICD-10-CM

## 2018-09-27 DIAGNOSIS — Z0000 Encounter for general adult medical examination without abnormal findings: Secondary | ICD-10-CM

## 2018-09-27 MED ORDER — MEDROXYPROGESTERONE ACETATE 5 MG PO TABS: ORAL_TABLET | ORAL | 1 refills | Status: DC

## 2018-09-27 NOTE — Patient Instructions (Signed)

## 2018-09-28 LAB — LIPID PANEL
Chol/HDL Ratio: 2.4 ratio (ref 0.0–4.4)
Cholesterol, Total: 171 mg/dL (ref 100–199)
HDL: 72 mg/dL (ref >39)
LDL CALC: 84 mg/dL (ref 0–99)
Triglycerides: 74 mg/dL (ref 0–149)
VLDL Cholesterol Cal: 15 mg/dL (ref 5–40)

## 2018-09-28 LAB — CBC
HEMATOCRIT: 39.7 % (ref 34.0–46.6)
HEMOGLOBIN: 13.3 g/dL (ref 11.1–15.9)
MCH: 30.9 pg (ref 26.6–33.0)
MCHC: 33.5 g/dL (ref 31.5–35.7)
MCV: 92 fL (ref 79–97)
Platelets: 362 10*3/uL (ref 150–450)
RBC: 4.31 x10E6/uL (ref 3.77–5.28)
RDW: 13 % (ref 11.7–15.4)
WBC: 8.2 10*3/uL (ref 3.4–10.8)

## 2018-09-28 LAB — COMPREHENSIVE METABOLIC PANEL
A/G RATIO: 1.9 (ref 1.2–2.2)
ALBUMIN: 4.5 g/dL (ref 3.8–4.9)
ALK PHOS: 46 IU/L (ref 39–117)
ALT: 10 IU/L (ref 0–32)
AST: 17 IU/L (ref 0–40)
BILIRUBIN TOTAL: 0.5 mg/dL (ref 0.0–1.2)
BUN / CREAT RATIO: 11 (ref 9–23)
BUN: 8 mg/dL (ref 6–24)
CHLORIDE: 104 mmol/L (ref 96–106)
CO2: 23 mmol/L (ref 20–29)
Calcium: 9.6 mg/dL (ref 8.7–10.2)
Creatinine, Ser: 0.7 mg/dL (ref 0.57–1.00)
GFR calc non Af Amer: 98 mL/min/{1.73_m2} (ref >59)
GFR, EST AFRICAN AMERICAN: 113 mL/min/{1.73_m2} (ref >59)
GLOBULIN, TOTAL: 2.4 g/dL (ref 1.5–4.5)
Glucose: 68 mg/dL (ref 65–99)
Potassium: 3.9 mmol/L (ref 3.5–5.2)
SODIUM: 143 mmol/L (ref 134–144)
TOTAL PROTEIN: 6.9 g/dL (ref 6.0–8.5)

## 2018-09-28 LAB — VITAMIN D 25 HYDROXY (VIT D DEFICIENCY, FRACTURES): VIT D 25 HYDROXY: 70 ng/mL (ref 30.0–100.0)

## 2018-10-02 LAB — CYTOLOGY - PAP
Diagnosis: NEGATIVE
HPV: NOT DETECTED

## 2018-10-20 ENCOUNTER — Other Ambulatory Visit: Payer: Self-pay | Admitting: Obstetrics and Gynecology

## 2018-10-20 DIAGNOSIS — Z1231 Encounter for screening mammogram for malignant neoplasm of breast: Secondary | ICD-10-CM

## 2018-11-10 ENCOUNTER — Ambulatory Visit
Admission: RE | Admit: 2018-11-10 | Discharge: 2018-11-10 | Disposition: A | Discharge status: Home / Self Care | Payer: 59 | Source: Ambulatory Visit | Attending: Obstetrics and Gynecology | Admitting: Obstetrics and Gynecology

## 2018-11-10 ENCOUNTER — Other Ambulatory Visit: Payer: Self-pay

## 2018-11-10 DIAGNOSIS — Z1231 Encounter for screening mammogram for malignant neoplasm of breast: Secondary | ICD-10-CM

## 2019-10-01 NOTE — Progress Notes (Deleted)
54 y.o. G6P3003 Married White or Caucasian Not Hispanic or Latino female here for annual exam.      No LMP recorded.          Sexually active: {yes no:314532}  The current method of family planning is {contraception:315051}.    Exercising: {yes no:314532}  {types:19826} Smoker:  {YES NO:22349}  Health Maintenance: Pap:  09/27/18  Neg HR HPV Neg 07-17-15 WNL NEG HR HPV  History of abnormal Pap:  no MMG:  11/10/18 Bi-rads 1 neg  BMD:   Never  Colonoscopy: 09-09-14 WNL repeat in 10 yrs TDaP:  06-05-13 Gardasil: NA   reports that she has never smoked. She has never used smokeless tobacco. She reports current alcohol use of about 1.0 standard drinks of alcohol per week. She reports that she does not use drugs.  Past Medical History:  Diagnosis Date  . Attention deficit disorder of adult   . Menorrhagia   . Microscopic hematuria    neg uro workup, Dr. Serita Butcher  . PMS (premenstrual syndrome)    symptoms better on Prozac    Past Surgical History:  Procedure Laterality Date  . ENDOMETRIAL ABLATION  09/20/05   HTA  . OVARIAN CYST REMOVAL  age 27    Current Outpatient Medications  Medication Sig Dispense Refill  . Cyanocobalamin (VITAMIN B 12 PO) Take by mouth.    . medroxyPROGESTERone (PROVERA) 5 MG tablet Take one tablet po qd x 5 days every 2-3 months if no spontaneous menses 15 tablet 1  . methylphenidate (CONCERTA) 36 MG CR tablet Take 36 mg by mouth every morning.    . Multiple Vitamin (MULTIVITAMIN) tablet Take 1 tablet by mouth daily.    . Naproxen Sodium (ALEVE) 220 MG CAPS Take by mouth as needed.    Marland Kitchen VITAMIN D PO Take by mouth.     No current facility-administered medications for this visit.    Family History  Problem Relation Age of Onset  . Hypertension Mother   . Hyperlipidemia Mother   . Osteoarthritis Mother   . Diabetes Mother   . Breast cancer Mother 48  . Cancer Father   . Melanoma Father        49s, multiple  . Breast cancer Maternal Aunt    postmeno  . Breast cancer Maternal Aunt        postmeno  . Diabetes Maternal Aunt   . Diabetes Maternal Grandfather   . Breast cancer Maternal Grandmother        diagnosed in 90s  . Melanoma Paternal Uncle        43s, melanoma with mets to brain  . Breast cancer Cousin        diagnosed in 31s    Review of Systems  Exam:   There were no vitals taken for this visit.  Weight change: @WEIGHTCHANGE @ Height:      Ht Readings from Last 3 Encounters:  09/27/18 5' 5.5" (1.664 m)  08/03/17 5' 4.75" (1.645 m)  08/02/16 5' 2.75" (1.594 m)    General appearance: alert, cooperative and appears stated age Head: Normocephalic, without obvious abnormality, atraumatic Neck: no adenopathy, supple, symmetrical, trachea midline and thyroid {CHL AMB PHY EX THYROID NORM DEFAULT:[phone]::"normal to inspection and palpation"} Lungs: clear to auscultation bilaterally Cardiovascular: regular rate and rhythm Breasts: {Exam; breast:13139::"normal appearance, no masses or tenderness"} Abdomen: soft, non-tender; non distended,  no masses,  no organomegaly Extremities: extremities normal, atraumatic, no cyanosis or edema Skin: Skin color, texture, turgor normal. No rashes or lesions  Lymph nodes: Cervical, supraclavicular, and axillary nodes normal. No abnormal inguinal nodes palpated Neurologic: Grossly normal   Pelvic: External genitalia:  no lesions              Urethra:  normal appearing urethra with no masses, tenderness or lesions              Bartholins and Skenes: normal                 Vagina: normal appearing vagina with normal color and discharge, no lesions              Cervix: {CHL AMB PHY EX CERVIX NORM [phone]::"no lesions"}               Bimanual Exam:  Uterus:  {CHL AMB PHY EX UTERUS NORM DEFAULT:[phone]::"normal size, contour, position, consistency, mobility, non-tender"}              Adnexa: {CHL AMB PHY EX ADNEXA NO MASS [phone]::"no mass, fullness,  tenderness"}               Rectovaginal: Confirms               Anus:  normal sphincter tone, no lesions  *** chaperoned for the exam.  A:  Well Woman with normal exam  P:

## 2019-10-03 ENCOUNTER — Ambulatory Visit: Payer: 59 | Admitting: Obstetrics and Gynecology

## 2019-11-01 ENCOUNTER — Ambulatory Visit: Payer: Self-pay | Admitting: Obstetrics and Gynecology

## 2019-11-22 ENCOUNTER — Other Ambulatory Visit: Payer: Self-pay | Admitting: Obstetrics and Gynecology

## 2019-11-22 DIAGNOSIS — Z1231 Encounter for screening mammogram for malignant neoplasm of breast: Secondary | ICD-10-CM

## 2019-12-12 ENCOUNTER — Ambulatory Visit
Admission: RE | Admit: 2019-12-12 | Discharge: 2019-12-12 | Disposition: A | Discharge status: Home / Self Care | Payer: No Typology Code available for payment source | Source: Ambulatory Visit | Attending: Obstetrics and Gynecology | Admitting: Obstetrics and Gynecology

## 2019-12-12 ENCOUNTER — Other Ambulatory Visit: Payer: Self-pay

## 2019-12-12 DIAGNOSIS — Z1231 Encounter for screening mammogram for malignant neoplasm of breast: Secondary | ICD-10-CM

## 2019-12-13 ENCOUNTER — Other Ambulatory Visit: Payer: Self-pay | Admitting: Obstetrics and Gynecology

## 2019-12-13 DIAGNOSIS — R928 Other abnormal and inconclusive findings on diagnostic imaging of breast: Secondary | ICD-10-CM

## 2019-12-14 ENCOUNTER — Other Ambulatory Visit: Payer: Self-pay

## 2019-12-14 ENCOUNTER — Ambulatory Visit
Admission: RE | Admit: 2019-12-14 | Discharge: 2019-12-14 | Disposition: A | Discharge status: Home / Self Care | Payer: No Typology Code available for payment source | Source: Ambulatory Visit | Attending: Obstetrics and Gynecology | Admitting: Obstetrics and Gynecology

## 2019-12-14 DIAGNOSIS — R928 Other abnormal and inconclusive findings on diagnostic imaging of breast: Secondary | ICD-10-CM

## 2019-12-20 ENCOUNTER — Telehealth: Payer: Self-pay

## 2019-12-20 DIAGNOSIS — Z9189 Other specified personal risk factors, not elsewhere classified: Secondary | ICD-10-CM

## 2019-12-20 DIAGNOSIS — Z803 Family history of malignant neoplasm of breast: Secondary | ICD-10-CM

## 2019-12-20 NOTE — Telephone Encounter (Signed)
Patient is calling in regards to genetic testing referral and would like to know the contact information to get tested for her and her mother. Patient stated she was giving the information on a previous visit but cannot remember the information.

## 2019-12-20 NOTE — Telephone Encounter (Signed)
Last AEX 09/27/18 Next AEX 01/02/20  Last screening MMG 12/12/19. Left breast Dx MMG on 12/14/19 for left breast calcifications.   Strong family Hx of breast cancer. 32.7 % lifetime risk of breast cancer.   Dr. Oscar La -please advise on referral to genetics.

## 2019-12-21 NOTE — Telephone Encounter (Signed)
Please refer them to Maylon Cos at the [facility]

## 2019-12-24 NOTE — Telephone Encounter (Signed)
Call placed to patient, left detailed message, ok per dpr, name identified on voicemail. Advised patient a referral has been placed to Maylon Cos, Runner, broadcasting/film/video at [facility]. Our office referral coordinator will f/u with appt information once scheduled. Return call to office if any additional questions.   Routing to [employer].   Encounter closed.

## 2020-01-01 ENCOUNTER — Telehealth: Payer: Self-pay | Admitting: *Deleted

## 2020-01-01 NOTE — Telephone Encounter (Signed)
Patient removed from MMG hold.   Encounter closed.  

## 2020-01-01 NOTE — Telephone Encounter (Signed)
Patient is in [facility] hold for recall from screen at [facility] on 12/12/19 for left breast calcifications.   Dx MMG of left breast on 12/14/19 IMPRESSION: The calcifications in the left breast are benign milk of calcium.  RECOMMENDATION: Screening mammogram in one year.(Code:[id number])  Dr. Oscar La -ok to remove from [facility] hold?

## 2020-01-01 NOTE — Telephone Encounter (Signed)
Please remove from hold  

## 2020-01-02 ENCOUNTER — Ambulatory Visit: Payer: Self-pay | Admitting: Obstetrics and Gynecology

## 2020-07-11 ENCOUNTER — Ambulatory Visit: Payer: Self-pay | Admitting: Obstetrics and Gynecology

## 2020-08-14 NOTE — Progress Notes (Deleted)
54 y.o. G13P3003 Married White or Caucasian Not Hispanic or Latino female here for annual exam.      No LMP recorded. Patient is perimenopausal.          Sexually active: {yes no:314532}  The current method of family planning is {contraception:315051}.    Exercising: {yes no:314532}  {types:19826} Smoker:  {YES NO:22349}  Health Maintenance: Pap: 09/27/18 WNL HR HPV Neg, 07-17-15 WNL NEG HR HPV 05-22-12 WNL NEG HR HPV History of abnormal Pap:  no MMG:  12/14/19 Density C Bi-rads 2 benign  BMD:   None  Colonoscopy:09-09-14 WNL repeat in 10 yrs  TDaP:  06/05/13 Gardasil: NA   reports that she has never smoked. She has never used smokeless tobacco. She reports current alcohol use of about 1.0 standard drink of alcohol per week. She reports that she does not use drugs.  Past Medical History:  Diagnosis Date  . Attention deficit disorder of adult   . Menorrhagia   . Microscopic hematuria    neg uro workup, Dr. Aldean Ast  . PMS (premenstrual syndrome)    symptoms better on Prozac    Past Surgical History:  Procedure Laterality Date  . ENDOMETRIAL ABLATION  09/20/05   HTA  . OVARIAN CYST REMOVAL  age 50    Current Outpatient Medications  Medication Sig Dispense Refill  . Cyanocobalamin (VITAMIN B 12 PO) Take by mouth.    . medroxyPROGESTERone (PROVERA) 5 MG tablet Take one tablet po qd x 5 days every 2-3 months if no spontaneous menses 15 tablet 1  . methylphenidate (CONCERTA) 36 MG CR tablet Take 36 mg by mouth every morning.    . Multiple Vitamin (MULTIVITAMIN) tablet Take 1 tablet by mouth daily.    . Naproxen Sodium (ALEVE) 220 MG CAPS Take by mouth as needed.    Marland Kitchen VITAMIN D PO Take by mouth.     No current facility-administered medications for this visit.    Family History  Problem Relation Age of Onset  . Hypertension Mother   . Hyperlipidemia Mother   . Osteoarthritis Mother   . Diabetes Mother   . Breast cancer Mother 62  . Cancer Father   . Melanoma Father         54s, multiple  . Breast cancer Maternal Aunt        postmeno  . Breast cancer Maternal Aunt        postmeno  . Diabetes Maternal Aunt   . Diabetes Maternal Grandfather   . Breast cancer Maternal Grandmother        diagnosed in 90s  . Melanoma Paternal Uncle        53s, melanoma with mets to brain  . Breast cancer Cousin        diagnosed in 75s    Review of Systems  Exam:   There were no vitals taken for this visit.  Weight change: @WEIGHTCHANGE @ Height:      Ht Readings from Last 3 Encounters:  09/27/18 5' 5.5" (1.664 m)  08/03/17 5' 4.75" (1.645 m)  08/02/16 5' 2.75" (1.594 m)    General appearance: alert, cooperative and appears stated age Head: Normocephalic, without obvious abnormality, atraumatic Neck: no adenopathy, supple, symmetrical, trachea midline and thyroid {CHL AMB PHY EX THYROID NORM DEFAULT:[phone]::"normal to inspection and palpation"} Lungs: clear to auscultation bilaterally Cardiovascular: regular rate and rhythm Breasts: {Exam; breast:13139::"normal appearance, no masses or tenderness"} Abdomen: soft, non-tender; non distended,  no masses,  no organomegaly Extremities: extremities normal, atraumatic, no cyanosis  or edema Skin: Skin color, texture, turgor normal. No rashes or lesions Lymph nodes: Cervical, supraclavicular, and axillary nodes normal. No abnormal inguinal nodes palpated Neurologic: Grossly normal   Pelvic: External genitalia:  no lesions              Urethra:  normal appearing urethra with no masses, tenderness or lesions              Bartholins and Skenes: normal                 Vagina: normal appearing vagina with normal color and discharge, no lesions              Cervix: {CHL AMB PHY EX CERVIX NORM DEFAULT:[phone]::"no lesions"}               Bimanual Exam:  Uterus:  {CHL AMB PHY EX UTERUS NORM [phone]::"normal size, contour, position, consistency, mobility, non-tender"}              Adnexa: {CHL AMB PHY EX ADNEXA NO  MASS DEFAULT:[phone]::"no mass, fullness, tenderness"}               Rectovaginal: Confirms               Anus:  normal sphincter tone, no lesions  *** chaperoned for the exam.  A:  Well Woman with normal exam  P:

## 2020-08-18 ENCOUNTER — Ambulatory Visit: Payer: Self-pay | Admitting: Obstetrics and Gynecology

## 2021-01-06 NOTE — Progress Notes (Signed)
55 y.o. G18P3003 Married White or Caucasian Not Hispanic or Latino female here for annual exam. No cycle in about 2 years  Patient has right lower abdominal cramping from time to time.  H/O endometrial ablation. At the time of her last annual exam in 1/20 she was still having irregular cycles and had a script for cyclic progesterone. No longer having cycles, thinks her last cycle was at least 1.5 years ago.  No vasomotor symptoms, no vaginal dryness.   She is seeing an Curator. She is taking daily micronized progesterone.   She is on testosterone pellets for libido and fatigue. Feels so much better on it. She was having side effects on the testosterone and backed down on her dose. Her libido is not as high on the lower dose progesterone, but better than when she was off it.  She was put on spironolactone secondary to acne from the testosterone.   Able to enjoy sex and have orgasms. No dyspareunia.    She has a strong family history or breast cancer.  Her risk of breast cancer without genetic testing is 32.7%. Mom with negative genetic testing.   Postmenopausal.        Sexually active: Yes.    The current method of family planning is none.    Exercising: Yes.    Tennis, Cardio, light weights Smoker:  no  Health Maintenance: Pap:  09/27/18 negative, negative HPV; 07-17-15 WNL NEG HR HPV 05-22-12 WNL NEG HR HPV History of abnormal Pap:  no MMG:  12/14/19 density C Bi-rads 2 benign  Last breast MRI was in 6/19 and was negative.  BMD:   None  Colonoscopy: 09-09-14 WNL repeat in 10 yrs TDaP:  06/05/13 Gardasil: na    reports that she has never smoked. She has never used smokeless tobacco. She reports current alcohol use of about 1.0 standard drink of alcohol per week. She reports that she does not use drugs. 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 Attention deficit disorder of adult   . Menorrhagia   .  Microscopic hematuria    neg uro workup, Dr. Aldean Ast  . PMS (premenstrual syndrome)    symptoms better on Prozac    Past Surgical History:  Procedure Laterality Date  . ENDOMETRIAL ABLATION  09/20/05   HTA  . OVARIAN CYST REMOVAL  age 74    Current Outpatient Medications  Medication Sig Dispense Refill  . Ascorbic Acid (VITAMIN C) 1000 MG tablet 1 tablet    . Cyanocobalamin (VITAMIN B 12 PO) Take by mouth.    . methylphenidate (CONCERTA) 36 MG CR tablet Take 36 mg by mouth every morning.    . Multiple Vitamin (MULTIVITAMIN) tablet Take 1 tablet by mouth daily.    . Naproxen Sodium 220 MG CAPS Take by mouth as needed.    . progesterone (PROMETRIUM) 100 MG capsule See admin instructions.    . RESTASIS 0.05 % ophthalmic emulsion     . spironolactone (ALDACTONE) 25 MG tablet Take 2 tablets by mouth daily.    . Testosterone 25 MG PLLT See admin instructions.    Marland Kitchen VITAMIN D PO Take by mouth.     No current facility-administered medications for this visit.    Family History  Problem Relation Age of Onset  . Hypertension Mother   . Hyperlipidemia Mother   . Osteoarthritis Mother   . Diabetes Mother   . Breast  cancer Mother 95  . Cancer Father   . Melanoma Father        24s, multiple  . Breast cancer Maternal Aunt        postmeno  . Breast cancer Maternal Aunt        postmeno  . Diabetes Maternal Aunt   . Diabetes Maternal Grandfather   . Breast cancer Maternal Grandmother        diagnosed in 90s  . Melanoma Paternal Uncle        74s, melanoma with mets to brain  . Breast cancer Cousin        diagnosed in 45s    Review of Systems  All other systems reviewed and are negative.   Exam:   BP 122/60   Pulse 69   Ht 5\' 5"  (1.651 m)   Wt 127 lb (57.6 kg)   SpO2 100%   BMI 21.13 kg/m   Weight change: @WEIGHTCHANGE @ Height:   Height: 5\' 5"  (165.1 cm)  Ht Readings from Last 3 Encounters:  01/08/21 5\' 5"  (1.651 m)  09/27/18 5' 5.5" (1.664 m)  08/03/17 5' 4.75" (1.645  m)    General appearance: alert, cooperative and appears stated age Head: Normocephalic, without obvious abnormality, atraumatic Neck: no adenopathy, supple, symmetrical, trachea midline and thyroid normal to inspection and palpation Lungs: clear to auscultation bilaterally Cardiovascular: regular rate and rhythm Breasts: normal appearance, no masses or tenderness Abdomen: soft, non-tender; non distended,  no masses,  no organomegaly Extremities: extremities normal, atraumatic, no cyanosis or edema Skin: Skin color, texture, turgor normal. No rashes or lesions Lymph nodes: Cervical, supraclavicular, and axillary nodes normal. No abnormal inguinal nodes palpated Neurologic: Grossly normal   Pelvic: External genitalia:  no lesions              Urethra:  normal appearing urethra with no masses, tenderness or lesions              Bartholins and Skenes: normal                 Vagina: normal appearing vagina with normal color and discharge, no lesions              Cervix: no lesions               Bimanual Exam:  Uterus:  normal size, contour, position, consistency, mobility, non-tender              Adnexa: no mass, fullness, tenderness               Rectovaginal: Confirms               Anus:  normal sphincter tone, no lesions  chaperoned for the exam.  1. Well woman exam Discussed breast self exam Discussed calcium and vit D intake Screening labs with primary Colonoscopy UTD  2. Increased risk of breast cancer Mammogram overdue, she will schedule Will order breast MRI in 6 months Recommended she come off of Prometrium secondary to risk of breast cancer  3. Low libido She is on testosterone pellets. Discussed the risks, recommended she not use pellets.  - Testos,Total,Free and SHBG (Female)  4. Family history of malignant melanoma She is getting yearly skin checks .

## 2021-01-08 ENCOUNTER — Other Ambulatory Visit: Payer: Self-pay

## 2021-01-08 ENCOUNTER — Ambulatory Visit (INDEPENDENT_AMBULATORY_CARE_PROVIDER_SITE_OTHER): Payer: 59 | Admitting: Obstetrics and Gynecology

## 2021-01-08 ENCOUNTER — Encounter: Payer: Self-pay | Admitting: Obstetrics and Gynecology

## 2021-01-08 VITALS — BP 122/60 | HR 69 | Ht 65.0 in | Wt 127.0 lb

## 2021-01-08 DIAGNOSIS — R6882 Decreased libido: Secondary | ICD-10-CM | POA: Diagnosis not present

## 2021-01-08 DIAGNOSIS — Z9189 Other specified personal risk factors, not elsewhere classified: Secondary | ICD-10-CM | POA: Diagnosis not present

## 2021-01-08 DIAGNOSIS — Z808 Family history of malignant neoplasm of other organs or systems: Secondary | ICD-10-CM

## 2021-01-08 DIAGNOSIS — Z01419 Encounter for gynecological examination (general) (routine) without abnormal findings: Secondary | ICD-10-CM | POA: Diagnosis not present

## 2021-01-08 NOTE — Patient Instructions (Signed)

## 2021-01-12 LAB — TESTOS,TOTAL,FREE AND SHBG (FEMALE)
Free Testosterone: 18.7 pg/mL — ABNORMAL HIGH (ref 0.1–6.4)
Sex Hormone Binding: 67 nmol/L (ref 14–73)
Testosterone, Total, LC-MS-MS: 178 ng/dL — ABNORMAL HIGH (ref 2–45)

## 2021-01-20 ENCOUNTER — Other Ambulatory Visit: Payer: Self-pay | Admitting: Obstetrics and Gynecology

## 2021-01-20 DIAGNOSIS — Z1231 Encounter for screening mammogram for malignant neoplasm of breast: Secondary | ICD-10-CM

## 2021-01-23 ENCOUNTER — Ambulatory Visit
Admission: RE | Admit: 2021-01-23 | Discharge: 2021-01-23 | Disposition: A | Discharge status: Home / Self Care | Payer: No Typology Code available for payment source | Source: Ambulatory Visit

## 2021-01-23 ENCOUNTER — Other Ambulatory Visit: Payer: Self-pay

## 2021-01-23 DIAGNOSIS — Z1231 Encounter for screening mammogram for malignant neoplasm of breast: Secondary | ICD-10-CM

## 2021-03-24 ENCOUNTER — Telehealth: Payer: Self-pay | Admitting: *Deleted

## 2021-03-24 DIAGNOSIS — Z9189 Other specified personal risk factors, not elsewhere classified: Secondary | ICD-10-CM

## 2021-03-24 NOTE — Telephone Encounter (Signed)
Patient last mammogram was on 12/2019. Mri will be done in Nov. 2022, will place the order closer to this date.

## 2021-03-24 NOTE — Telephone Encounter (Signed)
-----   Message from Romualdo Bolk, MD sent at 01/08/2021  2:06 PM EDT ----- This patient has an elevated risk of breast cancer. Please set her up for breast MRI in 6 months. She is setting up her mammogram.  Thanks, Noreene Larsson

## 2021-06-16 NOTE — Telephone Encounter (Signed)
Order placed for breast MRI, I called and left detailed message on patient voicemail order has been placed at [facility] and she can call to schedule for Nov. 2022. I asked her to call me if any questions.

## 2021-06-25 NOTE — Telephone Encounter (Signed)
MRI scheduled on 07/29/21 at [facility]. Will route to [facility] to see if prior approval is needed.

## 2021-06-30 NOTE — Telephone Encounter (Signed)
PA obtained.  Encounter previously closed.

## 2021-07-29 ENCOUNTER — Ambulatory Visit
Admission: RE | Admit: 2021-07-29 | Discharge: 2021-07-29 | Disposition: A | Discharge status: Home / Self Care | Payer: 59 | Source: Ambulatory Visit | Attending: Obstetrics and Gynecology | Admitting: Obstetrics and Gynecology

## 2021-07-29 ENCOUNTER — Other Ambulatory Visit: Payer: Self-pay

## 2021-07-29 DIAGNOSIS — Z9189 Other specified personal risk factors, not elsewhere classified: Secondary | ICD-10-CM

## 2021-07-29 MED ORDER — GADOBUTROL 1 MMOL/ML IV SOLN
6.0000 mL | Freq: Once | INTRAVENOUS | Status: AC | PRN
  Administered 2021-07-29: 6 mL via INTRAVENOUS

## 2021-12-09 DIAGNOSIS — Z20822 Contact with and (suspected) exposure to covid-19: Secondary | ICD-10-CM | POA: Diagnosis not present

## 2021-12-09 DIAGNOSIS — Z03818 Encounter for observation for suspected exposure to other biological agents ruled out: Secondary | ICD-10-CM | POA: Diagnosis not present

## 2021-12-09 DIAGNOSIS — J0100 Acute maxillary sinusitis, unspecified: Secondary | ICD-10-CM | POA: Diagnosis not present

## 2021-12-24 ENCOUNTER — Other Ambulatory Visit: Payer: Self-pay | Admitting: Obstetrics and Gynecology

## 2021-12-24 DIAGNOSIS — Z1231 Encounter for screening mammogram for malignant neoplasm of breast: Secondary | ICD-10-CM

## 2021-12-31 DIAGNOSIS — Z79899 Other long term (current) drug therapy: Secondary | ICD-10-CM | POA: Diagnosis not present

## 2021-12-31 DIAGNOSIS — Z13220 Encounter for screening for lipoid disorders: Secondary | ICD-10-CM | POA: Diagnosis not present

## 2022-01-07 DIAGNOSIS — R5382 Chronic fatigue, unspecified: Secondary | ICD-10-CM | POA: Diagnosis not present

## 2022-01-07 DIAGNOSIS — Z719 Counseling, unspecified: Secondary | ICD-10-CM | POA: Diagnosis not present

## 2022-01-07 DIAGNOSIS — R799 Abnormal finding of blood chemistry, unspecified: Secondary | ICD-10-CM | POA: Diagnosis not present

## 2022-01-07 DIAGNOSIS — D649 Anemia, unspecified: Secondary | ICD-10-CM | POA: Diagnosis not present

## 2022-01-07 DIAGNOSIS — E279 Disorder of adrenal gland, unspecified: Secondary | ICD-10-CM | POA: Diagnosis not present

## 2022-01-07 DIAGNOSIS — R5383 Other fatigue: Secondary | ICD-10-CM | POA: Diagnosis not present

## 2022-01-07 DIAGNOSIS — Z79890 Hormone replacement therapy (postmenopausal): Secondary | ICD-10-CM | POA: Diagnosis not present

## 2022-01-26 ENCOUNTER — Ambulatory Visit
Admission: RE | Admit: 2022-01-26 | Discharge: 2022-01-26 | Disposition: A | Discharge status: Home / Self Care | Payer: 59 | Source: Ambulatory Visit | Attending: Obstetrics and Gynecology | Admitting: Obstetrics and Gynecology

## 2022-01-26 DIAGNOSIS — Z1231 Encounter for screening mammogram for malignant neoplasm of breast: Secondary | ICD-10-CM | POA: Diagnosis not present

## 2022-03-30 NOTE — Progress Notes (Signed)
56 y.o. G66P3003 Married White or Caucasian Not Hispanic or Latino female here for annual exam.  No vaginal bleeding. Sexually active, no pain.  She has not had any testosterone pellets for 7 months. She is only taking 1/2 a tab of progesterone as well. See notes from 01/08/21 and lab results from the same date.  Doing okay off of the testosterone, energy is not quite as good.     Patient's last menstrual period was 06/13/2019.          Sexually active: Yes.    The current method of family planning is post menopausal status.    Exercising: Yes.     Orange theory and Tennis  Smoker:  no  Health Maintenance: Pap:   09/27/18 negative, negative HPV; 07-17-15 WNL NEG HR HPV 05-22-12 WNL NEG HR HPV History of abnormal Pap:  no MMG:  01/26/22 Bi-rads 1 neg  Breast MR 07/29/21, normal BMD:   none  Colonoscopy:  09-09-14 WNL repeat in 10 yrs TDaP:  06/05/13 Gardasil: na    reports that she has never smoked. She has never used smokeless tobacco. She reports current alcohol use of about 1.0 standard drink of alcohol per week. She reports that she does not use drugs. 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 She has 3 grown kids. No grandchildren.  Past Medical History:  Diagnosis Date   Attention deficit disorder of adult    Menorrhagia    Microscopic hematuria    neg uro workup, Dr. Aldean Ast   PMS (premenstrual syndrome)    symptoms better on Prozac    Past Surgical History:  Procedure Laterality Date   ENDOMETRIAL ABLATION  09/20/05   HTA   OVARIAN CYST REMOVAL  age 2    Current Outpatient Medications  Medication Sig Dispense Refill   Ascorbic Acid (VITAMIN C) 1000 MG tablet 1 tablet     Cyanocobalamin (VITAMIN B 12 PO) Take by mouth.     MAGNESIUM PO Take by mouth.     methylphenidate (CONCERTA) 36 MG CR tablet Take 36 mg by mouth every morning.     Multiple Vitamin (MULTIVITAMIN) tablet Take 1 tablet by mouth daily.     Naproxen Sodium 220 MG CAPS Take by mouth as  needed.     progesterone (PROMETRIUM) 100 MG capsule 50 mg See admin instructions. 1/2 tab     RESTASIS 0.05 % ophthalmic emulsion      spironolactone (ALDACTONE) 25 MG tablet Take 2 tablets by mouth daily.     VITAMIN D PO Take by mouth.     No current facility-administered medications for this visit.    Family History  Problem Relation Age of Onset   Hypertension Mother    Hyperlipidemia Mother    Osteoarthritis Mother    Diabetes Mother    Breast cancer Mother 12   Cancer Father    Melanoma Father        76s, multiple   Breast cancer Maternal Aunt        postmeno   Breast cancer Maternal Aunt        postmeno   Diabetes Maternal Aunt    Diabetes Maternal Grandfather    Breast cancer Maternal Grandmother        diagnosed in 31s   Melanoma Paternal Uncle        72s, melanoma with mets to brain   Breast cancer Cousin        diagnosed in 106s  Review of Systems  All other systems reviewed and are negative.   Exam:   BP 110/62   Pulse 78   Ht 5\' 5"  (1.651 m)   Wt 132 lb (59.9 kg)   LMP 06/13/2019   SpO2 99%   BMI 21.97 kg/m   Weight change: @WEIGHTCHANGE @ Height:   Height: 5\' 5"  (165.1 cm)  Ht Readings from Last 3 Encounters:  04/06/22 5\' 5"  (1.651 m)  01/08/21 5\' 5"  (1.651 m)  09/27/18 5' 5.5" (1.664 m)    General appearance: alert, cooperative and appears stated age Head: Normocephalic, without obvious abnormality, atraumatic Neck: no adenopathy, supple, symmetrical, trachea midline and thyroid normal to inspection and palpation Lungs: clear to auscultation bilaterally Cardiovascular: regular rate and rhythm Breasts: normal appearance, no masses or tenderness Abdomen: soft, non-tender; non distended,  no masses,  no organomegaly Extremities: extremities normal, atraumatic, no cyanosis or edema Skin: Skin color, texture, turgor normal. No rashes or lesions Lymph nodes: Cervical, supraclavicular, and axillary nodes normal. No abnormal inguinal nodes  palpated Neurologic: Grossly normal   Pelvic: External genitalia:  no lesions              Urethra:  normal appearing urethra with no masses, tenderness or lesions              Bartholins and Skenes: normal                 Vagina: normal appearing vagina with normal color and discharge, no lesions              Cervix: no lesions               Bimanual Exam:  Uterus:  normal size, contour, position, consistency, mobility, non-tender              Adnexa: no mass, fullness, tenderness               Rectovaginal: Confirms               Anus:  normal sphincter tone, no lesions  , CMA chaperoned for the exam.  1. Well woman exam Discussed calcium and vit D intake Labs and colonoscopy are UTD No pap this year  2. Increased risk of breast cancer Discussed breast self exam Mammogram UTD Breast MRI in 12/23

## 2022-04-06 ENCOUNTER — Ambulatory Visit (INDEPENDENT_AMBULATORY_CARE_PROVIDER_SITE_OTHER): Payer: 59 | Admitting: Obstetrics and Gynecology

## 2022-04-06 ENCOUNTER — Telehealth: Payer: Self-pay

## 2022-04-06 ENCOUNTER — Encounter: Payer: Self-pay | Admitting: Obstetrics and Gynecology

## 2022-04-06 VITALS — BP 110/62 | HR 78 | Ht 65.0 in | Wt 132.0 lb

## 2022-04-06 DIAGNOSIS — Z9189 Other specified personal risk factors, not elsewhere classified: Secondary | ICD-10-CM

## 2022-04-06 DIAGNOSIS — Z01419 Encounter for gynecological examination (general) (routine) without abnormal findings: Secondary | ICD-10-CM | POA: Diagnosis not present

## 2022-04-06 NOTE — Patient Instructions (Signed)

## 2022-04-06 NOTE — Telephone Encounter (Signed)
Romualdo Bolk, MD  P [facility] Triage Patient due for next breast MRI in 12/23, elevated risk of breast cancer.  Please schedule.

## 2022-04-06 NOTE — Telephone Encounter (Signed)
I called patient and per DPR access note on file left message in voice mail that we do not usually schedule MRI's this far out from when she needs to have it.  MRI often requires PA and it is only good for 30 days and we have to keep track of it. I told her we will call her beginning of November or she can even call us to schedule. Phone # provided for any questions.

## 2022-07-27 NOTE — Telephone Encounter (Signed)
Patient called for order to be placed at [facility] imaging. Order placed for breast MRI, patient will call to schedule.

## 2022-08-14 ENCOUNTER — Other Ambulatory Visit: Payer: 59

## 2022-08-25 NOTE — Telephone Encounter (Signed)
Patient was scheduled for 08/14/22 for Breast MRI per Dr. Oscar La but it was cancelled because not authorized. The note on the appt said they will not reschedule it until they have authorization on file.

## 2022-09-14 NOTE — Telephone Encounter (Signed)
MRI breast with and without scheduled for 09/28/22 at [place].  Will route to [place] although she may already have PA on this.

## 2022-09-28 ENCOUNTER — Ambulatory Visit
Admission: RE | Admit: 2022-09-28 | Discharge: 2022-09-28 | Disposition: A | Discharge status: Home / Self Care | Payer: 59 | Source: Ambulatory Visit | Attending: Obstetrics and Gynecology | Admitting: Obstetrics and Gynecology

## 2022-09-28 DIAGNOSIS — Z9189 Other specified personal risk factors, not elsewhere classified: Secondary | ICD-10-CM

## 2022-09-28 DIAGNOSIS — Z1239 Encounter for other screening for malignant neoplasm of breast: Secondary | ICD-10-CM | POA: Diagnosis not present

## 2022-09-28 MED ORDER — GADOPICLENOL 0.5 MMOL/ML IV SOLN
6.0000 mL | Freq: Once | INTRAVENOUS | Status: AC | PRN
  Administered 2022-09-28: 6 mL via INTRAVENOUS

## 2022-09-29 ENCOUNTER — Other Ambulatory Visit: Payer: Self-pay | Admitting: Obstetrics and Gynecology

## 2022-09-29 DIAGNOSIS — R928 Other abnormal and inconclusive findings on diagnostic imaging of breast: Secondary | ICD-10-CM

## 2022-09-30 HISTORY — PX: BREAST BIOPSY: SHX20

## 2022-10-11 ENCOUNTER — Ambulatory Visit
Admission: RE | Admit: 2022-10-11 | Discharge: 2022-10-11 | Disposition: A | Discharge status: Home / Self Care | Payer: 59 | Source: Ambulatory Visit | Attending: Obstetrics and Gynecology | Admitting: Obstetrics and Gynecology

## 2022-10-11 DIAGNOSIS — R928 Other abnormal and inconclusive findings on diagnostic imaging of breast: Secondary | ICD-10-CM

## 2022-10-11 DIAGNOSIS — N6342 Unspecified lump in left breast, subareolar: Secondary | ICD-10-CM | POA: Diagnosis not present

## 2022-10-11 DIAGNOSIS — N6320 Unspecified lump in the left breast, unspecified quadrant: Secondary | ICD-10-CM

## 2022-10-11 DIAGNOSIS — N6012 Diffuse cystic mastopathy of left breast: Secondary | ICD-10-CM | POA: Diagnosis not present

## 2022-10-11 DIAGNOSIS — Z803 Family history of malignant neoplasm of breast: Secondary | ICD-10-CM

## 2022-10-11 MED ORDER — GADOPICLENOL 0.5 MMOL/ML IV SOLN
6.0000 mL | Freq: Once | INTRAVENOUS | Status: AC | PRN
  Administered 2022-10-11: 6 mL via INTRAVENOUS

## 2023-01-20 DIAGNOSIS — Z23 Encounter for immunization: Secondary | ICD-10-CM | POA: Diagnosis not present

## 2023-01-20 DIAGNOSIS — Z79899 Other long term (current) drug therapy: Secondary | ICD-10-CM | POA: Diagnosis not present

## 2023-01-20 DIAGNOSIS — R002 Palpitations: Secondary | ICD-10-CM | POA: Diagnosis not present

## 2023-01-20 DIAGNOSIS — Z0000 Encounter for general adult medical examination without abnormal findings: Secondary | ICD-10-CM | POA: Diagnosis not present

## 2023-01-20 DIAGNOSIS — Z13220 Encounter for screening for lipoid disorders: Secondary | ICD-10-CM | POA: Diagnosis not present

## 2023-02-03 DIAGNOSIS — F900 Attention-deficit hyperactivity disorder, predominantly inattentive type: Secondary | ICD-10-CM | POA: Diagnosis not present

## 2023-02-03 DIAGNOSIS — F331 Major depressive disorder, recurrent, moderate: Secondary | ICD-10-CM | POA: Diagnosis not present

## 2023-02-14 ENCOUNTER — Encounter: Payer: Self-pay | Admitting: Obstetrics and Gynecology

## 2023-02-14 DIAGNOSIS — N6489 Other specified disorders of breast: Secondary | ICD-10-CM

## 2023-02-25 ENCOUNTER — Other Ambulatory Visit: Payer: Self-pay | Admitting: Obstetrics and Gynecology

## 2023-02-25 DIAGNOSIS — Z1231 Encounter for screening mammogram for malignant neoplasm of breast: Secondary | ICD-10-CM

## 2023-02-28 ENCOUNTER — Telehealth: Payer: Self-pay | Admitting: *Deleted

## 2023-02-28 DIAGNOSIS — N6320 Unspecified lump in the left breast, unspecified quadrant: Secondary | ICD-10-CM

## 2023-02-28 NOTE — Telephone Encounter (Signed)
Patient left message on surgery line requesting return call regarding order for 6 month f/u breast MRI.   Per review of Breast Bx results dated 10/11/22, Bilateral Breast MRI recommended in 6 months per protocol. 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   Patient notified. Patient will contact [facility] directly to schedule after 03/29/23. Patient aware benefits will be reviewed once scheduled to determine if PA required.   Routing to provider for final review. Patient is agreeable to disposition. Will close encounter.  Last AEX 04/06/22 -JJ  Routing to Dr. Edward Jolly

## 2023-03-10 ENCOUNTER — Ambulatory Visit
Admission: RE | Admit: 2023-03-10 | Discharge: 2023-03-10 | Disposition: A | Discharge status: Home / Self Care | Payer: 59 | Source: Ambulatory Visit | Attending: Obstetrics and Gynecology | Admitting: Obstetrics and Gynecology

## 2023-03-10 DIAGNOSIS — Z1231 Encounter for screening mammogram for malignant neoplasm of breast: Secondary | ICD-10-CM

## 2023-03-30 ENCOUNTER — Other Ambulatory Visit: Payer: 59

## 2023-04-04 ENCOUNTER — Encounter: Payer: Self-pay | Admitting: Obstetrics and Gynecology

## 2023-04-05 ENCOUNTER — Ambulatory Visit
Admission: RE | Admit: 2023-04-05 | Discharge: 2023-04-05 | Disposition: A | Discharge status: Home / Self Care | Payer: 59 | Source: Ambulatory Visit | Attending: Obstetrics and Gynecology | Admitting: Obstetrics and Gynecology

## 2023-04-05 DIAGNOSIS — N6320 Unspecified lump in the left breast, unspecified quadrant: Secondary | ICD-10-CM | POA: Diagnosis not present

## 2023-04-05 DIAGNOSIS — Z09 Encounter for follow-up examination after completed treatment for conditions other than malignant neoplasm: Secondary | ICD-10-CM | POA: Diagnosis not present

## 2023-04-05 MED ORDER — GADOPICLENOL 0.5 MMOL/ML IV SOLN
6.0000 mL | Freq: Once | INTRAVENOUS | Status: AC | PRN
  Administered 2023-04-05: 6 mL via INTRAVENOUS

## 2023-04-12 ENCOUNTER — Ambulatory Visit: Payer: 59 | Admitting: Obstetrics and Gynecology

## 2023-04-12 ENCOUNTER — Encounter: Payer: Self-pay | Admitting: Radiology

## 2023-04-12 ENCOUNTER — Other Ambulatory Visit (HOSPITAL_COMMUNITY)
Admission: RE | Admit: 2023-04-12 | Discharge: 2023-04-12 | Disposition: A | Discharge status: Home / Self Care | Payer: 59 | Source: Ambulatory Visit | Attending: Radiology | Admitting: Radiology

## 2023-04-12 ENCOUNTER — Ambulatory Visit (INDEPENDENT_AMBULATORY_CARE_PROVIDER_SITE_OTHER): Payer: 59 | Admitting: Radiology

## 2023-04-12 VITALS — BP 100/68 | Ht 64.0 in | Wt 131.0 lb

## 2023-04-12 DIAGNOSIS — Z01419 Encounter for gynecological examination (general) (routine) without abnormal findings: Secondary | ICD-10-CM

## 2023-04-12 DIAGNOSIS — Z9189 Other specified personal risk factors, not elsewhere classified: Secondary | ICD-10-CM

## 2023-04-12 NOTE — Progress Notes (Signed)
   Sabrina Blair [birth year] [id number]   History: Postmenopausal 57 y.o. presents for annual exam. Strong family history of breast cancer in mother and maternal aunts. Genetic testing negative, lifetime risk 32.7% Normal breast MRI last week. She is using testosterone pellets from integrative medicine. She feels better while using and for her the benefits outweighs the risk, libido, energy and strength improved. Plays tennis and goes to [employer] twice a week with her daughter.   Gynecologic History Postmenopausal Last Pap: 2020. Results were: normal Last mammogram: 03/10/23. Results were: normal. Breast MRI last week normal Last colonoscopy: 2016   Obstetric History OB History  Gravida Para Term Preterm AB Living  3 3 3  0 0 3  SAB IAB Ectopic Multiple Live Births  0 0 0 0 3    # Outcome Date GA Lbr Len/2nd Weight Sex Type Anes PTL Lv  3 Term 1999    M Vag-Spont   LIV  2 Term 1997    F Vag-Spont   LIV  1 Term 77    F Vag-Spont   LIV     The following portions of the patient's history were reviewed and updated as appropriate: allergies, current medications, past family history, past medical history, past social history, past surgical history, and problem list.  Review of Systems Pertinent items noted in HPI and remainder of comprehensive ROS otherwise negative.  Past medical history, past surgical history, family history and social history were all reviewed and documented in the EPIC chart.  Exam:  Vitals:   04/12/23 1437  BP: 100/68  Weight: 131 lb (59.4 kg)  Height: 5\' 4"  (1.626 m)   Body mass index is 22.49 kg/m.  General appearance:  Normal Thyroid:  Symmetrical, normal in size, without palpable masses or nodularity. Respiratory  Auscultation:  Clear without wheezing or rhonchi Cardiovascular  Auscultation:  Regular rate, without rubs, murmurs or gallops  Edema/varicosities:  Not grossly evident Abdominal  Soft,nontender, without masses, guarding or  rebound.  Liver/spleen:  No organomegaly noted  Hernia:  None appreciated  Skin  Inspection:  Grossly normal Breasts: Examined lying and sitting.   Right: Without masses, retractions, nipple discharge or axillary adenopathy.   Left: Without masses, retractions, nipple discharge or axillary adenopathy. Genitourinary   Inguinal/mons:  Normal without inguinal adenopathy  External genitalia:  Normal appearing vulva with no masses, tenderness, or lesions  BUS/Urethra/Skene's glands:  Normal  Vagina:  Normal appearing with normal color and discharge, no lesions.   Cervix:  Normal appearing without discharge or lesions  Uterus:  Normal in size, shape and contour.  Midline and mobile, nontender  Adnexa/parametria:     Rt: Normal in size, without masses or tenderness.   Lt: Normal in size, without masses or tenderness.  Anus and perineum: Normal    Sabrina Blair, CMA present for exam  Assessment/Plan:   1. Well woman exam with routine gynecological exam - Cytology - PAP( [place])  2. Increased risk of breast cancer Risk 32.7% continue yearly mammogram and breast MRI Risks and benefits of testosterone use reviewed, pt desires to continue pellet therapy    Discussed SBE, colonoscopy and DEXA screening as directed. Recommend of exercise weekly, including weight bearing exercise. Encouraged the use of seatbelts and sunscreen.  Return in 1 year for annual or sooner prn.  Arlie Solomons B WHNP-BC, 3:58 PM 04/12/2023

## 2023-04-19 DIAGNOSIS — R5382 Chronic fatigue, unspecified: Secondary | ICD-10-CM | POA: Diagnosis not present

## 2023-04-19 DIAGNOSIS — R799 Abnormal finding of blood chemistry, unspecified: Secondary | ICD-10-CM | POA: Diagnosis not present

## 2023-04-19 DIAGNOSIS — R5383 Other fatigue: Secondary | ICD-10-CM | POA: Diagnosis not present

## 2023-04-19 DIAGNOSIS — E559 Vitamin D deficiency, unspecified: Secondary | ICD-10-CM | POA: Diagnosis not present

## 2023-09-07 NOTE — Telephone Encounter (Signed)
 Call placed to patient. Patient expressed frustration not having any breast imaging in 1 year by the time next MMG is due. Advised insurance will likely not cover screening MMG early or MRI if she has had them less than one year from the previous imaging. Advised she would need to reach out to insurance and TBC to review scheduling imaging early and coverage. Recommendations were based on previous imaging, can get back on 6 month schedule after next MMG completed, as Jami recommended MRI 6 mo later. Patient states this is not what was discussed at prior visits. Offered OV or MyChart visit to further discuss, patient declined. Patient states she is going to think about how she wants to proceed and return call if she needs anything additional. Advised I will provide update to [facility] and f/u with any additional recommendations.   Routing to Jami

## 2023-09-07 NOTE — Telephone Encounter (Signed)
 Next mammogram due 02/2024. I would then recommend MRI 6months later.

## 2023-09-07 NOTE — Telephone Encounter (Signed)
 Screening MMG 03/10/23 -Bi rads 1neg, screening MMG 1 year  04/05/23 -MRI -BiRads 2 benign; Continue routine annual screening MMG   Jami -please review and advise.  MMG and MRI not due.

## 2023-09-22 DIAGNOSIS — J069 Acute upper respiratory infection, unspecified: Secondary | ICD-10-CM | POA: Diagnosis not present

## 2023-10-05 DIAGNOSIS — J019 Acute sinusitis, unspecified: Secondary | ICD-10-CM | POA: Diagnosis not present

## 2023-10-05 DIAGNOSIS — B9689 Other specified bacterial agents as the cause of diseases classified elsewhere: Secondary | ICD-10-CM | POA: Diagnosis not present

## 2023-10-27 DIAGNOSIS — F331 Major depressive disorder, recurrent, moderate: Secondary | ICD-10-CM | POA: Diagnosis not present

## 2023-10-27 DIAGNOSIS — F900 Attention-deficit hyperactivity disorder, predominantly inattentive type: Secondary | ICD-10-CM | POA: Diagnosis not present

## 2023-12-22 DIAGNOSIS — F900 Attention-deficit hyperactivity disorder, predominantly inattentive type: Secondary | ICD-10-CM | POA: Diagnosis not present

## 2023-12-22 DIAGNOSIS — F331 Major depressive disorder, recurrent, moderate: Secondary | ICD-10-CM | POA: Diagnosis not present

## 2024-01-25 ENCOUNTER — Other Ambulatory Visit: Payer: Self-pay | Admitting: Obstetrics and Gynecology

## 2024-01-25 DIAGNOSIS — Z1231 Encounter for screening mammogram for malignant neoplasm of breast: Secondary | ICD-10-CM

## 2024-03-15 ENCOUNTER — Ambulatory Visit
Admission: RE | Admit: 2024-03-15 | Discharge: 2024-03-15 | Disposition: A | Discharge status: Home / Self Care | Source: Ambulatory Visit | Attending: Obstetrics and Gynecology | Admitting: Obstetrics and Gynecology

## 2024-03-15 DIAGNOSIS — Z1231 Encounter for screening mammogram for malignant neoplasm of breast: Secondary | ICD-10-CM | POA: Diagnosis not present

## 2024-03-23 DIAGNOSIS — H9202 Otalgia, left ear: Secondary | ICD-10-CM | POA: Diagnosis not present

## 2024-03-23 DIAGNOSIS — Z79899 Other long term (current) drug therapy: Secondary | ICD-10-CM | POA: Diagnosis not present

## 2024-03-23 DIAGNOSIS — Z13220 Encounter for screening for lipoid disorders: Secondary | ICD-10-CM | POA: Diagnosis not present

## 2024-03-23 DIAGNOSIS — Z0000 Encounter for general adult medical examination without abnormal findings: Secondary | ICD-10-CM | POA: Diagnosis not present

## 2024-03-27 DIAGNOSIS — Z79899 Other long term (current) drug therapy: Secondary | ICD-10-CM | POA: Diagnosis not present

## 2024-04-10 DIAGNOSIS — E559 Vitamin D deficiency, unspecified: Secondary | ICD-10-CM | POA: Diagnosis not present

## 2024-04-10 DIAGNOSIS — R5383 Other fatigue: Secondary | ICD-10-CM | POA: Diagnosis not present

## 2024-04-26 ENCOUNTER — Ambulatory Visit
Admission: RE | Admit: 2024-04-26 | Discharge: 2024-04-26 | Disposition: A | Discharge status: Home / Self Care | Source: Ambulatory Visit

## 2024-04-26 ENCOUNTER — Other Ambulatory Visit: Payer: Self-pay

## 2024-04-26 DIAGNOSIS — M47812 Spondylosis without myelopathy or radiculopathy, cervical region: Secondary | ICD-10-CM | POA: Diagnosis not present

## 2024-04-26 DIAGNOSIS — M542 Cervicalgia: Secondary | ICD-10-CM

## 2024-06-07 ENCOUNTER — Encounter: Payer: Self-pay | Admitting: Radiology

## 2024-06-07 ENCOUNTER — Ambulatory Visit (INDEPENDENT_AMBULATORY_CARE_PROVIDER_SITE_OTHER): Payer: 59 | Admitting: Radiology

## 2024-06-07 VITALS — BP 112/68 | HR 72 | Ht 65.0 in | Wt 129.6 lb

## 2024-06-07 DIAGNOSIS — Z1331 Encounter for screening for depression: Secondary | ICD-10-CM

## 2024-06-07 DIAGNOSIS — Z9189 Other specified personal risk factors, not elsewhere classified: Secondary | ICD-10-CM

## 2024-06-07 DIAGNOSIS — Z01419 Encounter for gynecological examination (general) (routine) without abnormal findings: Secondary | ICD-10-CM

## 2024-06-07 DIAGNOSIS — K644 Residual hemorrhoidal skin tags: Secondary | ICD-10-CM | POA: Diagnosis not present

## 2024-06-07 DIAGNOSIS — R9230 Dense breasts, unspecified: Secondary | ICD-10-CM

## 2024-06-07 NOTE — Progress Notes (Signed)
   Sabrina Blair [birth year] [id number]   History: Postmenopausal 58 y.o. presents for annual exam. Strong family history of breast cancer in mother and maternal aunts. Genetic testing negative, lifetime risk 32.7% Normal breast MRI 8/24. She is still using testosterone  pellets from integrative medicine. Requests referral for hemorrhoid removal.   Gynecologic History Postmenopausal Last Pap: 2024. Results were: normal Last mammogram: 03/15/24. Results were: normal.  Last colonoscopy: 2016     06/07/2024    2:44 PM  Depression screen PHQ 2/9  Decreased Interest 0  Down, Depressed, Hopeless 0  PHQ - 2 Score 0     Obstetric History OB History  Gravida Para Term Preterm AB Living  3 3 3  0 0 3  SAB IAB Ectopic Multiple Live Births  0 0 0 0 3    # Outcome Date GA Lbr Len/2nd Weight Sex Type Anes PTL Lv  3 Term 1999    M Vag-Spont   LIV  2 Term 1997    F Vag-Spont   LIV  1 Term 64    F Vag-Spont   LIV     The following portions of the patient's history were reviewed and updated as appropriate: allergies, current medications, past family history, past medical history, past social history, past surgical history, and problem list.  Review of Systems Pertinent items noted in HPI and remainder of comprehensive ROS otherwise negative.  Past medical history, past surgical history, family history and social history were all reviewed and documented in the EPIC chart.  Exam:  Vitals:   06/07/24 1438  BP: 112/68  Pulse: 72  SpO2: 99%  Weight: 129 lb 9.6 oz (58.8 kg)  Height: 5' 5 (1.651 m)    Body mass index is 21.57 kg/m.  General appearance:  Normal Thyroid:  Symmetrical, normal in size, without palpable masses or nodularity. Respiratory  Auscultation:  Clear without wheezing or rhonchi Cardiovascular  Auscultation:  Regular rate, without rubs, murmurs or gallops  Edema/varicosities:  Not grossly evident Abdominal  Soft,nontender, without masses, guarding or  rebound.  Liver/spleen:  No organomegaly noted  Hernia:  None appreciated  Skin  Inspection:  Grossly normal Breasts: Examined lying and sitting.   Right: Without masses, retractions, nipple discharge or axillary adenopathy.   Left: Without masses, retractions, nipple discharge or axillary adenopathy. Genitourinary   Inguinal/mons:  Normal without inguinal adenopathy  External genitalia:  Normal appearing vulva with no masses, tenderness, or lesions  BUS/Urethra/Skene's glands:  Normal  Vagina:  Normal appearing with normal color and discharge, no lesions.   Cervix:  Normal appearing without discharge or lesions  Uterus:  Normal in size, shape and contour.  Midline and mobile, nontender  Adnexa/parametria:     Rt: Normal in size, without masses or tenderness.   Lt: Normal in size, without masses or tenderness.  Anus and perineum: Multiple stages of external hemorrhoids and associated skin tags    Dereck Keas, CMA present for exam  Assessment/Plan:   1. Well woman exam with routine gynecological exam Pap 2027 Yearly mammo Labs with PCP  2. Increased risk of breast cancer Risk 32.7% continue yearly mammogram and breast MRI - MR BREAST BILATERAL W WO CONTRAST INC CAD; Future  3. Hemorrhoids, external - Ambulatory referral to General Surgery  4. Depression screening      Return in 1 year for annual or sooner prn.  Antowan Samford B WHNP-BC, 2:47 PM 06/07/2024

## 2024-06-13 DIAGNOSIS — F331 Major depressive disorder, recurrent, moderate: Secondary | ICD-10-CM | POA: Diagnosis not present

## 2024-06-13 DIAGNOSIS — F900 Attention-deficit hyperactivity disorder, predominantly inattentive type: Secondary | ICD-10-CM | POA: Diagnosis not present

## 2024-08-06 ENCOUNTER — Encounter: Payer: Self-pay | Admitting: Radiology

## 2024-08-13 DIAGNOSIS — H0288A Meibomian gland dysfunction right eye, upper and lower eyelids: Secondary | ICD-10-CM | POA: Diagnosis not present

## 2024-08-13 DIAGNOSIS — H0288B Meibomian gland dysfunction left eye, upper and lower eyelids: Secondary | ICD-10-CM | POA: Diagnosis not present

## 2024-08-13 DIAGNOSIS — H33321 Round hole, right eye: Secondary | ICD-10-CM | POA: Diagnosis not present

## 2024-08-13 DIAGNOSIS — L718 Other rosacea: Secondary | ICD-10-CM | POA: Diagnosis not present

## 2024-08-13 DIAGNOSIS — H16223 Keratoconjunctivitis sicca, not specified as Sjogren's, bilateral: Secondary | ICD-10-CM | POA: Diagnosis not present

## 2024-08-15 ENCOUNTER — Encounter: Payer: Self-pay | Admitting: Radiology

## 2024-08-24 ENCOUNTER — Other Ambulatory Visit

## 2024-08-29 ENCOUNTER — Other Ambulatory Visit

## 2024-09-06 ENCOUNTER — Other Ambulatory Visit

## 2024-09-06 ENCOUNTER — Encounter: Payer: Self-pay | Admitting: Radiology

## 2024-09-10 ENCOUNTER — Ambulatory Visit
Admission: RE | Admit: 2024-09-10 | Discharge: 2024-09-10 | Disposition: A | Source: Ambulatory Visit | Attending: Radiology | Admitting: Radiology

## 2024-09-10 DIAGNOSIS — R9230 Dense breasts, unspecified: Secondary | ICD-10-CM

## 2024-09-10 DIAGNOSIS — Z9189 Other specified personal risk factors, not elsewhere classified: Secondary | ICD-10-CM

## 2024-09-10 MED ORDER — GADOPICLENOL 0.5 MMOL/ML IV SOLN
6.0000 mL | Freq: Once | INTRAVENOUS | Status: AC | PRN
  Administered 2024-09-10: 6 mL via INTRAVENOUS

## 2024-09-11 ENCOUNTER — Ambulatory Visit: Payer: Self-pay | Admitting: Radiology

## 2024-09-18 ENCOUNTER — Other Ambulatory Visit

## 2024-09-20 ENCOUNTER — Other Ambulatory Visit

## 2025-06-11 ENCOUNTER — Ambulatory Visit: Admitting: Radiology
